# Patient Record
Sex: Male | Born: 1960 | Race: White | Hispanic: No | Marital: Single | Smoking: Current every day smoker
Health system: Southern US, Community
[De-identification: ages and names within clinical notes are randomized; demographics above are authoritative.]

## PROBLEM LIST (undated history)

## (undated) DIAGNOSIS — I513 Intracardiac thrombosis, not elsewhere classified: Secondary | ICD-10-CM

## (undated) DIAGNOSIS — E7800 Pure hypercholesterolemia, unspecified: Secondary | ICD-10-CM

## (undated) DIAGNOSIS — IMO0002 Reserved for concepts with insufficient information to code with codable children: Secondary | ICD-10-CM

## (undated) DIAGNOSIS — I2510 Atherosclerotic heart disease of native coronary artery without angina pectoris: Secondary | ICD-10-CM

## (undated) DIAGNOSIS — I255 Ischemic cardiomyopathy: Secondary | ICD-10-CM

## (undated) DIAGNOSIS — I219 Acute myocardial infarction, unspecified: Secondary | ICD-10-CM

## (undated) DIAGNOSIS — Z720 Tobacco use: Secondary | ICD-10-CM

## (undated) DIAGNOSIS — I5021 Acute systolic (congestive) heart failure: Secondary | ICD-10-CM

## (undated) DIAGNOSIS — E785 Hyperlipidemia, unspecified: Secondary | ICD-10-CM

## (undated) DIAGNOSIS — I5022 Chronic systolic (congestive) heart failure: Secondary | ICD-10-CM

## (undated) DIAGNOSIS — I10 Essential (primary) hypertension: Secondary | ICD-10-CM

## (undated) DIAGNOSIS — R0600 Dyspnea, unspecified: Secondary | ICD-10-CM

## (undated) DIAGNOSIS — E1165 Type 2 diabetes mellitus with hyperglycemia: Secondary | ICD-10-CM

## (undated) HISTORY — PX: NO PAST SURGERIES: SHX2092

## (undated) HISTORY — DX: Acute systolic (congestive) heart failure: I50.21

## (undated) HISTORY — DX: Pure hypercholesterolemia, unspecified: E78.00

## (undated) HISTORY — DX: Acute myocardial infarction, unspecified: I21.9

---

## 2019-08-08 ENCOUNTER — Encounter (HOSPITAL_COMMUNITY)
Admission: EM | Disposition: A | Discharge status: Home / Self Care | Payer: Self-pay | Source: Home / Self Care | Attending: Cardiology

## 2019-08-08 ENCOUNTER — Inpatient Hospital Stay (HOSPITAL_COMMUNITY)
Admission: EM | Admit: 2019-08-08 | Discharge: 2019-08-14 | DRG: 280 | Disposition: A | Discharge status: Home / Self Care | Payer: Self-pay | Attending: Cardiology | Admitting: Cardiology

## 2019-08-08 ENCOUNTER — Encounter (HOSPITAL_COMMUNITY): Payer: Self-pay | Admitting: Cardiology

## 2019-08-08 ENCOUNTER — Emergency Department (HOSPITAL_COMMUNITY): Payer: Self-pay

## 2019-08-08 DIAGNOSIS — IMO0002 Reserved for concepts with insufficient information to code with codable children: Secondary | ICD-10-CM

## 2019-08-08 DIAGNOSIS — I255 Ischemic cardiomyopathy: Secondary | ICD-10-CM | POA: Diagnosis present

## 2019-08-08 DIAGNOSIS — Z7982 Long term (current) use of aspirin: Secondary | ICD-10-CM

## 2019-08-08 DIAGNOSIS — I25119 Atherosclerotic heart disease of native coronary artery with unspecified angina pectoris: Secondary | ICD-10-CM | POA: Diagnosis present

## 2019-08-08 DIAGNOSIS — I214 Non-ST elevation (NSTEMI) myocardial infarction: Secondary | ICD-10-CM

## 2019-08-08 DIAGNOSIS — Z720 Tobacco use: Secondary | ICD-10-CM

## 2019-08-08 DIAGNOSIS — I5043 Acute on chronic combined systolic (congestive) and diastolic (congestive) heart failure: Secondary | ICD-10-CM | POA: Diagnosis present

## 2019-08-08 DIAGNOSIS — I2584 Coronary atherosclerosis due to calcified coronary lesion: Secondary | ICD-10-CM | POA: Diagnosis present

## 2019-08-08 DIAGNOSIS — I5020 Unspecified systolic (congestive) heart failure: Secondary | ICD-10-CM

## 2019-08-08 DIAGNOSIS — I2510 Atherosclerotic heart disease of native coronary artery without angina pectoris: Secondary | ICD-10-CM

## 2019-08-08 DIAGNOSIS — I472 Ventricular tachycardia: Secondary | ICD-10-CM | POA: Diagnosis present

## 2019-08-08 DIAGNOSIS — R52 Pain, unspecified: Secondary | ICD-10-CM

## 2019-08-08 DIAGNOSIS — R0602 Shortness of breath: Secondary | ICD-10-CM

## 2019-08-08 DIAGNOSIS — E785 Hyperlipidemia, unspecified: Secondary | ICD-10-CM | POA: Diagnosis present

## 2019-08-08 DIAGNOSIS — I5021 Acute systolic (congestive) heart failure: Secondary | ICD-10-CM | POA: Insufficient documentation

## 2019-08-08 DIAGNOSIS — I10 Essential (primary) hypertension: Secondary | ICD-10-CM

## 2019-08-08 DIAGNOSIS — E1165 Type 2 diabetes mellitus with hyperglycemia: Secondary | ICD-10-CM | POA: Diagnosis present

## 2019-08-08 DIAGNOSIS — I513 Intracardiac thrombosis, not elsewhere classified: Secondary | ICD-10-CM | POA: Diagnosis present

## 2019-08-08 DIAGNOSIS — R42 Dizziness and giddiness: Secondary | ICD-10-CM | POA: Diagnosis not present

## 2019-08-08 DIAGNOSIS — Z6841 Body Mass Index (BMI) 40.0 and over, adult: Secondary | ICD-10-CM

## 2019-08-08 DIAGNOSIS — Z20828 Contact with and (suspected) exposure to other viral communicable diseases: Secondary | ICD-10-CM | POA: Diagnosis present

## 2019-08-08 HISTORY — DX: Chronic systolic (congestive) heart failure: I50.22

## 2019-08-08 HISTORY — DX: Intracardiac thrombosis, not elsewhere classified: I51.3

## 2019-08-08 HISTORY — DX: Essential (primary) hypertension: I10

## 2019-08-08 HISTORY — DX: Dyspnea, unspecified: R06.00

## 2019-08-08 HISTORY — DX: Reserved for concepts with insufficient information to code with codable children: IMO0002

## 2019-08-08 HISTORY — DX: Hyperlipidemia, unspecified: E78.5

## 2019-08-08 HISTORY — DX: Tobacco use: Z72.0

## 2019-08-08 HISTORY — DX: Morbid (severe) obesity due to excess calories: E66.01

## 2019-08-08 HISTORY — DX: Ischemic cardiomyopathy: I25.5

## 2019-08-08 HISTORY — PX: LEFT HEART CATH AND CORONARY ANGIOGRAPHY: CATH118249

## 2019-08-08 HISTORY — DX: Atherosclerotic heart disease of native coronary artery without angina pectoris: I25.10

## 2019-08-08 HISTORY — DX: Type 2 diabetes mellitus with hyperglycemia: E11.65

## 2019-08-08 HISTORY — DX: Non-ST elevation (NSTEMI) myocardial infarction: I21.4

## 2019-08-08 LAB — HEMOGLOBIN A1C
Hgb A1c MFr Bld: 10.2 % — ABNORMAL HIGH (ref 4.8–5.6)
Mean Plasma Glucose: 246.04 mg/dL

## 2019-08-08 LAB — APTT: aPTT: 26 seconds (ref 24–36)

## 2019-08-08 LAB — CBC
HCT: 47.9 % (ref 39.0–52.0)
Hemoglobin: 15.9 g/dL (ref 13.0–17.0)
MCH: 29.6 pg (ref 26.0–34.0)
MCHC: 33.2 g/dL (ref 30.0–36.0)
MCV: 89 fL (ref 80.0–100.0)
Platelets: 194 10*3/uL (ref 150–400)
RBC: 5.38 MIL/uL (ref 4.22–5.81)
RDW: 12.9 % (ref 11.5–15.5)
WBC: 11.3 10*3/uL — ABNORMAL HIGH (ref 4.0–10.5)
nRBC: 0 % (ref 0.0–0.2)

## 2019-08-08 LAB — GLUCOSE, CAPILLARY: Glucose-Capillary: 433 mg/dL — ABNORMAL HIGH (ref 70–99)

## 2019-08-08 LAB — LIPID PANEL
Cholesterol: 269 mg/dL — ABNORMAL HIGH (ref 0–200)
HDL: 41 mg/dL (ref >40)
LDL Cholesterol: 210 mg/dL — ABNORMAL HIGH (ref 0–99)
Total CHOL/HDL Ratio: 6.6 RATIO
Triglycerides: 89 mg/dL (ref <150)
VLDL: 18 mg/dL (ref 0–40)

## 2019-08-08 LAB — BASIC METABOLIC PANEL
Anion gap: 12 (ref 5–15)
BUN: 15 mg/dL (ref 6–20)
CO2: 20 mmol/L — ABNORMAL LOW (ref 22–32)
Calcium: 8.9 mg/dL (ref 8.9–10.3)
Chloride: 103 mmol/L (ref 98–111)
Creatinine, Ser: 1.26 mg/dL — ABNORMAL HIGH (ref 0.61–1.24)
GFR calc Af Amer: >60 mL/min (ref >60)
GFR calc non Af Amer: >60 mL/min (ref >60)
Glucose, Bld: 337 mg/dL — ABNORMAL HIGH (ref 70–99)
Potassium: 4.9 mmol/L (ref 3.5–5.1)
Sodium: 135 mmol/L (ref 135–145)

## 2019-08-08 LAB — BRAIN NATRIURETIC PEPTIDE
B Natriuretic Peptide: 469.2 pg/mL — ABNORMAL HIGH (ref 0.0–100.0)
B Natriuretic Peptide: 395.3 pg/mL — ABNORMAL HIGH (ref 0.0–100.0)

## 2019-08-08 LAB — TROPONIN I (HIGH SENSITIVITY)
Troponin I (High Sensitivity): 1047 ng/L (ref <18)
Troponin I (High Sensitivity): 3525 ng/L (ref <18)
Troponin I (High Sensitivity): 4988 ng/L (ref <18)
Troponin I (High Sensitivity): 5226 ng/L (ref <18)

## 2019-08-08 LAB — MAGNESIUM: Magnesium: 2 mg/dL (ref 1.7–2.4)

## 2019-08-08 LAB — SARS CORONAVIRUS 2 (TAT 6-24 HRS): SARS Coronavirus 2: NEGATIVE

## 2019-08-08 LAB — HIV ANTIBODY (ROUTINE TESTING W REFLEX): HIV Screen 4th Generation wRfx: NONREACTIVE

## 2019-08-08 LAB — POC SARS CORONAVIRUS 2 AG -  ED: SARS Coronavirus 2 Ag: NEGATIVE

## 2019-08-08 LAB — TSH: TSH: 2.4 u[IU]/mL (ref 0.350–4.500)

## 2019-08-08 LAB — PROTIME-INR
INR: 1.1 (ref 0.8–1.2)
Prothrombin Time: 13.6 seconds (ref 11.4–15.2)

## 2019-08-08 SURGERY — LEFT HEART CATH AND CORONARY ANGIOGRAPHY
Anesthesia: LOCAL

## 2019-08-08 MED ORDER — SODIUM CHLORIDE 0.9 % WEIGHT BASED INFUSION
3.0000 mL/kg/h | INTRAVENOUS | Status: AC
  Administered 2019-08-08: 3 mL/kg/h via INTRAVENOUS

## 2019-08-08 MED ORDER — ACETAMINOPHEN 325 MG PO TABS: 650.0000 mg | ORAL_TABLET | ORAL | Status: DC | PRN

## 2019-08-08 MED ORDER — NITROGLYCERIN IN D5W 200-5 MCG/ML-% IV SOLN
INTRAVENOUS | Status: AC | PRN
  Administered 2019-08-08: 20 ug/min via INTRAVENOUS

## 2019-08-08 MED ORDER — SODIUM CHLORIDE 0.9% FLUSH
3.0000 mL | Freq: Two times a day (BID) | INTRAVENOUS | Status: DC
  Administered 2019-08-08 – 2019-08-14 (×10): 3 mL via INTRAVENOUS

## 2019-08-08 MED ORDER — SODIUM CHLORIDE 0.9% FLUSH
3.0000 mL | Freq: Once | INTRAVENOUS | Status: AC
  Administered 2019-08-08: 3 mL via INTRAVENOUS

## 2019-08-08 MED ORDER — HEPARIN (PORCINE) IN NACL 1000-0.9 UT/500ML-% IV SOLN
INTRAVENOUS | Status: DC | PRN
  Administered 2019-08-08 (×2): 500 mL

## 2019-08-08 MED ORDER — ONDANSETRON HCL 4 MG/2ML IJ SOLN: 4.0000 mg | Freq: Four times a day (QID) | INTRAMUSCULAR | Status: DC | PRN

## 2019-08-08 MED ORDER — ANGIOPLASTY BOOK
Freq: Once | Status: AC
  Administered 2019-08-08: 10:00:00
  Filled 2019-08-08: qty 1

## 2019-08-08 MED ORDER — LIDOCAINE HCL (PF) 1 % IJ SOLN
INTRAMUSCULAR | Status: AC
  Filled 2019-08-08: qty 30

## 2019-08-08 MED ORDER — FENTANYL CITRATE (PF) 100 MCG/2ML IJ SOLN
INTRAMUSCULAR | Status: AC
  Filled 2019-08-08: qty 2

## 2019-08-08 MED ORDER — FENTANYL CITRATE (PF) 100 MCG/2ML IJ SOLN
INTRAMUSCULAR | Status: DC | PRN
  Administered 2019-08-08: 25 ug via INTRAVENOUS

## 2019-08-08 MED ORDER — SODIUM CHLORIDE 0.9% FLUSH: 3.0000 mL | Freq: Two times a day (BID) | INTRAVENOUS | Status: DC

## 2019-08-08 MED ORDER — ASPIRIN 81 MG PO CHEW
324.0000 mg | CHEWABLE_TABLET | Freq: Once | ORAL | Status: AC
  Administered 2019-08-08: 324 mg via ORAL
  Filled 2019-08-08: qty 4

## 2019-08-08 MED ORDER — ASPIRIN EC 81 MG PO TBEC
81.0000 mg | DELAYED_RELEASE_TABLET | Freq: Every day | ORAL | Status: DC
  Administered 2019-08-08 – 2019-08-14 (×7): 81 mg via ORAL
  Filled 2019-08-08 (×7): qty 1

## 2019-08-08 MED ORDER — SODIUM CHLORIDE 0.9 % IV SOLN: INTRAVENOUS | Status: AC

## 2019-08-08 MED ORDER — FUROSEMIDE 10 MG/ML IJ SOLN
INTRAMUSCULAR | Status: AC
  Filled 2019-08-08: qty 4

## 2019-08-08 MED ORDER — ALBUTEROL SULFATE HFA 108 (90 BASE) MCG/ACT IN AERS
8.0000 | INHALATION_SPRAY | Freq: Once | RESPIRATORY_TRACT | Status: AC
  Administered 2019-08-08: 8 via RESPIRATORY_TRACT
  Filled 2019-08-08: qty 6.7

## 2019-08-08 MED ORDER — INSULIN ASPART 100 UNIT/ML ~~LOC~~ SOLN
3.0000 [IU] | Freq: Once | SUBCUTANEOUS | Status: AC
  Administered 2019-08-08: 3 [IU] via SUBCUTANEOUS

## 2019-08-08 MED ORDER — SODIUM CHLORIDE 0.9 % IV SOLN: 250.0000 mL | INTRAVENOUS | Status: DC | PRN

## 2019-08-08 MED ORDER — IOHEXOL 350 MG/ML SOLN
INTRAVENOUS | Status: DC | PRN
  Administered 2019-08-08: 120 mL

## 2019-08-08 MED ORDER — SODIUM CHLORIDE 0.9 % WEIGHT BASED INFUSION
1.0000 mL/kg/h | INTRAVENOUS | Status: DC
  Administered 2019-08-08: 1 mL/kg/h via INTRAVENOUS

## 2019-08-08 MED ORDER — METHYLPREDNISOLONE SODIUM SUCC 125 MG IJ SOLR
125.0000 mg | Freq: Once | INTRAMUSCULAR | Status: AC
  Administered 2019-08-08: 125 mg via INTRAVENOUS
  Filled 2019-08-08: qty 2

## 2019-08-08 MED ORDER — LABETALOL HCL 5 MG/ML IV SOLN: 10.0000 mg | INTRAVENOUS | Status: AC | PRN

## 2019-08-08 MED ORDER — VERAPAMIL HCL 2.5 MG/ML IV SOLN
INTRAVENOUS | Status: AC
  Filled 2019-08-08: qty 2

## 2019-08-08 MED ORDER — CARVEDILOL 3.125 MG PO TABS: 3.1250 mg | ORAL_TABLET | Freq: Two times a day (BID) | ORAL | Status: DC

## 2019-08-08 MED ORDER — MIDAZOLAM HCL 2 MG/2ML IJ SOLN
INTRAMUSCULAR | Status: DC | PRN
  Administered 2019-08-08: 1 mg via INTRAVENOUS

## 2019-08-08 MED ORDER — NITROGLYCERIN IN D5W 200-5 MCG/ML-% IV SOLN
0.0000 ug/min | INTRAVENOUS | Status: DC
  Administered 2019-08-08 – 2019-08-10 (×2): 20 ug/min via INTRAVENOUS
  Filled 2019-08-08: qty 250

## 2019-08-08 MED ORDER — NITROGLYCERIN 0.4 MG SL SUBL: 0.4000 mg | SUBLINGUAL_TABLET | SUBLINGUAL | Status: DC | PRN

## 2019-08-08 MED ORDER — INSULIN ASPART 100 UNIT/ML ~~LOC~~ SOLN
0.0000 [IU] | Freq: Three times a day (TID) | SUBCUTANEOUS | Status: DC
  Administered 2019-08-09: 5 [IU] via SUBCUTANEOUS
  Administered 2019-08-09: 11 [IU] via SUBCUTANEOUS
  Administered 2019-08-09: 5 [IU] via SUBCUTANEOUS
  Administered 2019-08-10 – 2019-08-11 (×6): 3 [IU] via SUBCUTANEOUS
  Administered 2019-08-12 (×2): 2 [IU] via SUBCUTANEOUS
  Administered 2019-08-12: 5 [IU] via SUBCUTANEOUS
  Administered 2019-08-13: 18:00:00 3 [IU] via SUBCUTANEOUS
  Administered 2019-08-13: 10:00:00 5 [IU] via SUBCUTANEOUS
  Administered 2019-08-13: 13:00:00 2 [IU] via SUBCUTANEOUS
  Administered 2019-08-14: 08:00:00 3 [IU] via SUBCUTANEOUS

## 2019-08-08 MED ORDER — ATORVASTATIN CALCIUM 40 MG PO TABS
40.0000 mg | ORAL_TABLET | Freq: Every day | ORAL | Status: DC
  Administered 2019-08-08: 40 mg via ORAL
  Filled 2019-08-08: qty 1

## 2019-08-08 MED ORDER — HYDRALAZINE HCL 20 MG/ML IJ SOLN: 10.0000 mg | INTRAMUSCULAR | Status: AC | PRN

## 2019-08-08 MED ORDER — FUROSEMIDE 10 MG/ML IJ SOLN
INTRAMUSCULAR | Status: DC | PRN
  Administered 2019-08-08: 40 mg via INTRAVENOUS

## 2019-08-08 MED ORDER — HEPARIN (PORCINE) 25000 UT/250ML-% IV SOLN
1700.0000 [IU]/h | INTRAVENOUS | Status: DC
  Administered 2019-08-08: 1400 [IU]/h via INTRAVENOUS
  Filled 2019-08-08: qty 250

## 2019-08-08 MED ORDER — HEPARIN SODIUM (PORCINE) 1000 UNIT/ML IJ SOLN
INTRAMUSCULAR | Status: AC
  Filled 2019-08-08: qty 1

## 2019-08-08 MED ORDER — HEPARIN BOLUS VIA INFUSION
4000.0000 [IU] | Freq: Once | INTRAVENOUS | Status: AC
  Administered 2019-08-08: 4000 [IU] via INTRAVENOUS
  Filled 2019-08-08: qty 4000

## 2019-08-08 MED ORDER — HEPARIN SODIUM (PORCINE) 1000 UNIT/ML IJ SOLN
INTRAMUSCULAR | Status: DC | PRN
  Administered 2019-08-08: 6000 [IU] via INTRAVENOUS

## 2019-08-08 MED ORDER — INSULIN GLARGINE 100 UNIT/ML ~~LOC~~ SOLN
8.0000 [IU] | Freq: Every day | SUBCUTANEOUS | Status: DC
  Administered 2019-08-08: 8 [IU] via SUBCUTANEOUS
  Filled 2019-08-08 (×2): qty 0.08

## 2019-08-08 MED ORDER — VERAPAMIL HCL 2.5 MG/ML IV SOLN
INTRAVENOUS | Status: DC | PRN
  Administered 2019-08-08: 10 mL via INTRA_ARTERIAL

## 2019-08-08 MED ORDER — MIDAZOLAM HCL 2 MG/2ML IJ SOLN
INTRAMUSCULAR | Status: AC
  Filled 2019-08-08: qty 2

## 2019-08-08 MED ORDER — ASPIRIN 81 MG PO CHEW: 81.0000 mg | CHEWABLE_TABLET | Freq: Every day | ORAL | Status: DC

## 2019-08-08 MED ORDER — INSULIN ASPART 100 UNIT/ML ~~LOC~~ SOLN
0.0000 [IU] | Freq: Every day | SUBCUTANEOUS | Status: DC
  Administered 2019-08-08: 5 [IU] via SUBCUTANEOUS
  Administered 2019-08-09: 2 [IU] via SUBCUTANEOUS

## 2019-08-08 MED ORDER — SODIUM CHLORIDE 0.9% FLUSH: 3.0000 mL | INTRAVENOUS | Status: DC | PRN

## 2019-08-08 MED ORDER — LIDOCAINE HCL (PF) 1 % IJ SOLN
INTRAMUSCULAR | Status: DC | PRN
  Administered 2019-08-08: 5 mL

## 2019-08-08 MED ORDER — OXYCODONE HCL 5 MG PO TABS: 5.0000 mg | ORAL_TABLET | ORAL | Status: DC | PRN

## 2019-08-08 MED ORDER — HEPARIN (PORCINE) IN NACL 1000-0.9 UT/500ML-% IV SOLN
INTRAVENOUS | Status: AC
  Filled 2019-08-08: qty 1000

## 2019-08-08 MED ORDER — HEPARIN (PORCINE) 25000 UT/250ML-% IV SOLN
1400.0000 [IU]/h | INTRAVENOUS | Status: DC
  Administered 2019-08-08: 1400 [IU]/h via INTRAVENOUS
  Filled 2019-08-08: qty 500

## 2019-08-08 MED ORDER — ASPIRIN EC 325 MG PO TBEC: 325.0000 mg | DELAYED_RELEASE_TABLET | Freq: Every day | ORAL | Status: DC

## 2019-08-08 SURGICAL SUPPLY — 11 items
CATH 5FR JL3.5 JR4 ANG PIG MP (CATHETERS) ×2 IMPLANT
DEVICE RAD COMP TR BAND LRG (VASCULAR PRODUCTS) ×2 IMPLANT
GLIDESHEATH SLEND A-KIT 6F 22G (SHEATH) ×2 IMPLANT
GLIDESHEATH SLEND SS 6F .021 (SHEATH) IMPLANT
GUIDEWIRE INQWIRE 1.5J.035X260 (WIRE) ×1 IMPLANT
INQWIRE 1.5J .035X260CM (WIRE) ×2
KIT HEART LEFT (KITS) ×2 IMPLANT
PACK CARDIAC CATHETERIZATION (CUSTOM PROCEDURE TRAY) ×2 IMPLANT
SHEATH PROBE COVER 6X72 (BAG) ×2 IMPLANT
TRANSDUCER W/STOPCOCK (MISCELLANEOUS) ×2 IMPLANT
TUBING CIL FLEX 10 FLL-RA (TUBING) ×2 IMPLANT

## 2019-08-08 NOTE — Progress Notes (Signed)
Telemetry notified RN that pt had 19 run of v-tach, assessed and pt was asymptomatic, will continue to monitor. HR 118 and BP 122/90 @ 2220.   Elaina Hoops, RN

## 2019-08-08 NOTE — ED Provider Notes (Addendum)
[facility] EMERGENCY DEPARTMENT Provider Note   CSN: [id number] Arrival date & time: 08/08/19  0130     History   Chief Complaint Chief Complaint  Patient presents with  . Chest Pain  . Shortness of Breath    HPI Michael [name] is a 58 y.o. male.     The history is provided by the patient and medical records.  Chest Pain Associated symptoms: shortness of breath   Shortness of Breath Associated symptoms: chest pain      58 y.o. 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  States this has been an intermittent issues yesterday, worst last evening.   States he had Thanksgiving dinner with his family at nephews house and afterwards starting having SOB and chest discomfort.  Chest discomfort is substernal in nature, described as a mild pressure but not quite a heaviness.  He states SOB has continued to be intermittent, lasting a few minutes at a time.  SOB and pain worse with lying flat, better sitting upright.  He denies known cardiac history.  No known family cardiac history.  He is not a smoker.  He does report a recent cough, white phlegm produced and has felt some wheezing.  He denies any hemoptysis.   No pain with breathing.  States he does get some burning pain in his chest with breathing at times.  No meds PTA.  No past medical history on file.  There are no active problems to display for this patient.      Home Medications    Prior to Admission medications   Medication Sig Start Date End Date Taking? Authorizing Provider  aspirin 325 MG tablet Take 325 mg by mouth once.   Yes [provider]    Family History No family history on file.  Social History Social History   Tobacco Use  . Smoking status: Not on file  Substance Use Topics  . Alcohol use: Not on file  . Drug use: Not on file     Allergies   Patient has no known allergies.   Review of  Systems Review of Systems  Respiratory: Positive for shortness of breath.   Cardiovascular: Positive for chest pain.  All other systems reviewed and are negative.    Physical Exam Updated Vital Signs BP 126/85   Pulse (!) 108   Temp 98.6 F (37 C) (Oral)   Resp 20   SpO2 97%   Physical Exam Vitals signs and nursing note reviewed.  Constitutional:      Appearance: He is well-developed.     Comments: obese  HENT:     Head: Normocephalic and atraumatic.  Eyes:     Conjunctiva/sclera: Conjunctivae normal.     Pupils: Pupils are equal, round, and reactive to light.  Neck:     Musculoskeletal: Normal range of motion.  Cardiovascular:     Rate and Rhythm: Regular rhythm. Tachycardia present.     Heart sounds: Normal heart sounds.     Comments: Tachycardia around 105 during exam, sinus Pulmonary:     Effort: Pulmonary effort is normal.     Breath sounds: Wheezing present. No decreased breath sounds or rhonchi.     Comments: Expiratory wheezes noted, worse in the upper lobes, able to speak in sentences Abdominal:     General: Bowel sounds are normal.     Palpations: Abdomen is soft.  Musculoskeletal: Normal  range of motion.     Comments: Trace edema at the ankles  Skin:    General: Skin is warm and dry.  Neurological:     Mental Status: He is alert and oriented to person, place, and time.      ED Treatments / Results  Labs (all labs ordered are listed, but only abnormal results are displayed) Labs Reviewed  BASIC METABOLIC PANEL - Abnormal; Notable for the following components:      Result Value   CO2 20 (*)    Glucose, Bld 337 (*)    Creatinine, Ser 1.26 (*)    All other components within normal limits  CBC - Abnormal; Notable for the following components:   WBC 11.3 (*)    All other components within normal limits  BRAIN NATRIURETIC PEPTIDE - Abnormal; Notable for the following components:   B Natriuretic Peptide 469.2 (*)    All other components within  normal limits  TROPONIN I (HIGH SENSITIVITY) - Abnormal; Notable for the following components:   Troponin I (High Sensitivity) 1,047 (*)    All other components within normal limits  TROPONIN I (HIGH SENSITIVITY) - Abnormal; Notable for the following components:   Troponin I (High Sensitivity) 3,525 (*)    All other components within normal limits  SARS CORONAVIRUS 2 (TAT 6-24 HRS)  POC SARS CORONAVIRUS 2 AG -  ED    EKG EKG Interpretation  Date/Time:  Friday August 08 2019 01:41:08 EST Ventricular Rate:  112 PR Interval:    QRS Duration: 106 QT Interval:  346 QTC Calculation: 473 R Axis:   56 Text Interpretation: Sinus tachycardia Anterior infarct, old No previous ECGs available Confirmed by Zadie RhineWickline, Donald ([id number]) on 08/08/2019 1:48:53 AM   Radiology Dg Chest Port 1 View  Result Date: 08/08/2019 CLINICAL DATA:  Chest pain and shortness of breath EXAM: PORTABLE CHEST 1 VIEW COMPARISON:  None. FINDINGS: Cardiac shadow is mildly enlarged but accentuated by the portable technique. Vascular congestion is noted centrally with mild interstitial edema consistent with congestive failure. Small right pleural effusion is noted. Bibasilar atelectatic changes are noted. IMPRESSION: Changes consistent with mild CHF with bibasilar atelectatic changes. Electronically Signed   By: Alcide CleverMark  Lukens M.D.   On: 08/08/2019 03:27    Procedures Procedures (including critical care time)  CRITICAL CARE Performed by: Garlon HatchetLisa M Sanders   Total critical care time: 45 minutes  Critical care time was exclusive of separately billable procedures and treating other patients.  Critical care was necessary to treat or prevent imminent or life-threatening deterioration.  Critical care was time spent personally by me on the following activities: development of treatment plan with patient and/or surrogate as well as nursing, discussions with consultants, evaluation of patient's response to treatment, examination  of patient, obtaining history from patient or surrogate, ordering and performing treatments and interventions, ordering and review of laboratory studies, ordering and review of radiographic studies, pulse oximetry and re-evaluation of patient's condition.   Medications Ordered in ED Medications  sodium chloride flush (NS) 0.9 % injection 3 mL (3 mLs Intravenous Given 08/08/19 0437)  albuterol (VENTOLIN HFA) 108 (90 Base) MCG/ACT inhaler 8 puff (8 puffs Inhalation Given 08/08/19 0321)  methylPREDNISolone sodium succinate (SOLU-MEDROL) 125 mg/2 mL injection 125 mg (125 mg Intravenous Given 08/08/19 0322)  aspirin chewable tablet 324 mg (324 mg Oral Given 08/08/19 0456)     Initial Impression / Assessment and Plan / ED Course  I have reviewed the triage vital signs and the nursing notes.  Pertinent labs & imaging results that were available during my care of the patient were reviewed by me and considered in my medical decision making (see chart for details).  58-year-ol male here with substernal chest discomfort and intermittent shortness of breath since yesterday.  He has been having cough with white phlegm but no hemoptysis.  No fever or chills.  Brother has also been sick with URI symptoms, unknown if related to Covid as he has not had formal testing.  Symptoms worsened after eating dinner with family last evening.  Patient with a low-grade tachycardia around 105 during exam.  He does have some expiratory wheezes, worse in the upper lobes.  He does not appear significantly fluid overloaded, trace edema at the ankles.  EKG here sinus tachycardia without noted ischemic changes.  Labs as above--hyperglycemia without findings of DKA.  Troponin is 1047.  No known cardiac history, however has not had a physical exam or seen a doctor in several years.  CXR with some mild CHF and bibasilar atelectasis.  BNP added on.  Given some albuterol as well as solu-medrol for wheezing. Patient given aspirin.  Will  discuss with cardiology.  Discussed with cardiology Fellow, Dr. Ladean Raya-- will admit for ongoing care.  Will start Heparin drip.  Plan for cardiac cath later today.  Final Clinical Impressions(s) / ED Diagnoses   Final diagnoses:  NSTEMI (non-ST elevated myocardial infarction) [facility])    ED Discharge Orders    None       Garlon Hatchet, PA-C 08/08/19 0531    Garlon Hatchet, PA-C 08/08/19 7416    Shon Baton, MD 08/08/19 [phone]

## 2019-08-08 NOTE — Progress Notes (Signed)
ANTICOAGULATION CONSULT NOTE - Initial Consult  Pharmacy Consult for heparin Indication: chest pain/ACS  No Known Allergies  Patient Measurements: Height: 6' (182.9 cm) Weight: 300 lb (136.1 kg) IBW/kg (Calculated) : 77.6 Heparin Dosing Weight: 108.7 kg   Vital Signs: Temp: 98.6 F (37 C) (11/27 0140) Temp Source: Oral (11/27 0140) BP: 130/92 (11/27 0439) Pulse Rate: 112 (11/27 0439)  Labs: Recent Labs    08/08/19 0147 08/08/19 0347  HGB 15.9  --   HCT 47.9  --   PLT 194  --   CREATININE 1.26*  --   TROPONINIHS 1,047* 3,525*    Estimated Creatinine Clearance: 93.5 mL/min (A) (by C-G formula based on SCr of 1.26 mg/dL (H)).   Medical History: No past medical history on file.  Medications:  See medication history  Assessment: 58 yo man to start heparin for CP.  He was not on anticoagulation PTA.  Hg 15.9, PTLC 194 Goal of Therapy:  Heparin level 0.3-0.7 units/ml Monitor platelets by anticoagulation protocol: Yes   Plan:  Heparin 4000 unit bolus and drip at 1400 units/hr Check heparin level ~6 hours after start Daily HL and CBC while on heparin Monitor for bleeding complications  Makhayla Mcmurry Poteet 08/08/2019,6:16 AM

## 2019-08-08 NOTE — H&P (Signed)
Cardiology History & Physical    Patient ID: Michael [name] MRN: [id number], DOB/AGE: 56-Mar-1962   Admit date: 08/08/2019  Primary Physician: Patient, No Pcp Per Primary Cardiologist: No primary care provider on file.  Patient Profile    58 year old man with no known medical issues, has not seen a physician in > 10-years. He presented with intermittent chest pain and dyspnea, found to have troponin elevation prompting admission to cardiology.   History of Present Illness    Michael [name] reports no prior cardiac history, denies chronic medical diagnosis or medications, but says he thinks he may have diabetes. 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 He says over the past few years he has intermittent chest pain, he had one particularly bad episode 3-years ago after his mother died, but episodes always resolve on their own so he never sought medical attention.  Yesterday evening with family around Thanksgiving, he started having recurrent substernal chest pain associated with dyspnea, did not relent so he presented to our ED. CP resolved shortly afterwards and he is currently chest pain free. He does report current orthopnea, which is chronic. He also notes chronic exertional dyspnea with just walking up even a small hill. He says this has been slowly worsening over the past few years, but particularly in the past few months. He also notes episodes of PND and says his legs have been swollen.    Past Medical History   Past Medical History:  Diagnosis Date  . Dyspnea   . Tobacco use      Allergies No Known Allergies  Home Medications    Prior to Admission medications   Medication Sig Start Date End Date Taking? Authorizing Provider  aspirin 325 MG tablet Take 325 mg by mouth once.   Yes [provider]    Family History    No family history on file. He indicated that his mother  is deceased. He indicated that his father is deceased. + cardiac dz in family   Social History    Social History   Socioeconomic History  . Marital status: Single    Spouse name: Not on file  . Number of children: Not on file  . Years of education: Not on file  . Highest education level: Not on file  Occupational History  . Not on file  Social Needs  . Financial resource strain: Not on file  . Food insecurity    Worry: Not on file    Inability: Not on file  . Transportation needs    Medical: Not on file    Non-medical: Not on file  Tobacco Use  . Smoking status: Not on file  . Smokeless tobacco: Current User    Types: Chew  Substance and Sexual Activity  . Alcohol use: Not on file  . Drug use: Not on file  . Sexual activity: Not on file  Lifestyle  . Physical activity    Days per week: Not on file    Minutes per session: Not on file  . Stress: Not on file  Relationships  . Social Musicianconnections    Talks on phone: Not on file    Gets together: Not on file    Attends religious service: Not on file    Active member of club or organization: Not on file    Attends meetings of clubs or organizations: Not on file    Relationship  status: Not on file  . Intimate partner violence    Fear of current or ex partner: Not on file    Emotionally abused: Not on file    Physically abused: Not on file    Forced sexual activity: Not on file  Other Topics Concern  . Not on file  Social History Narrative  . Not on file     Review of Systems    General:  No chills, fever, night sweats or weight changes.  Cardiovascular:  See HPI, no palpitations Dermatological: No rash, lesions/masses Respiratory: No cough, dyspnea Urologic: No hematuria, dysuria Abdominal:   No nausea, vomiting, diarrhea, bright red blood per rectum, melena, or hematemesis Neurologic:  No visual changes, wkns, changes in mental status. All other systems reviewed and are otherwise negative except as noted above.   Physical Exam    BP 123/87   Pulse (!) 103   Temp 98.6 F (37 C) (Oral)   Resp 17   Ht 6' (1.829 m)   Wt 136.1 kg Comment: pt stated weight  SpO2 96%   BMI 40.69 kg/m  General: pleasant, obese, no distress sitting upright HEENT: Normal  Neck: JVP appears mild/mod elevated Lungs:  Resp regular and unlabored on 2L oxygen, diminished breath sounds in lung bases but no wheezing Heart: distant heart sounds, Regular rhythm, no S3, S4, or murmurs. Abdomen: Soft, non-tender, non-distended, BS +.  Extremities: 1+ bilateral LE edema, DP/PT/Radials 2+ and equal bilaterally. Psych: Normal affect. Neuro: Alert and oriented. No gross focal deficits. No abnormal movements.  Labs    Troponin (Point of Care Test) No results for input(s): TROPIPOC in the last 72 hours. No results for input(s): CKTOTAL, CKMB, TROPONINI in the last 72 hours. Lab Results  Component Value Date   WBC 11.3 (H) 08/08/2019   HGB 15.9 08/08/2019   HCT 47.9 08/08/2019   MCV 89.0 08/08/2019   PLT 194 08/08/2019    Recent Labs  Lab 08/08/19 0147  NA 135  K 4.9  CL 103  CO2 20*  BUN 15  CREATININE 1.26*  CALCIUM 8.9  GLUCOSE 337*   No results found for: CHOL, HDL, LDLCALC, TRIG No results found for: [facility]   Radiology Studies    Dg Chest Port 1 View  Result Date: 08/08/2019 CLINICAL DATA:  Chest pain and shortness of breath EXAM: PORTABLE CHEST 1 VIEW COMPARISON:  None. FINDINGS: Cardiac shadow is mildly enlarged but accentuated by the portable technique. Vascular congestion is noted centrally with mild interstitial edema consistent with congestive failure. Small right pleural effusion is noted. Bibasilar atelectatic changes are noted. IMPRESSION: Changes consistent with mild CHF with bibasilar atelectatic changes. Electronically Signed   By: Inez Catalina M.D.   On: 08/08/2019 03:27    ECG & Cardiac Imaging    - sinus tachycardia with anterior Q-waves V1-V4, no acute ischemic changes seen   Assessment & Plan    41M w/ no known cardiac history who presents with chest pain and troponin elevation, overall concerning for type 1 NSTEMI. Patient without medical care in > 10 years. Based on history I am concerned he's had prior undiagnosed MI, supporting this is marked anterior Q-waves on his ECG. Will start heparin infusion and keep NPO, plan for likely LHC later today. Additional will send BNP and obtain TTE, expect he will have significant LV dysfunction on echocardiogram. Pending return of above workup and labs, would also plan for diuresis later this AM.    NSTEMI, suspect type 1  -  troponin 1,047 -> 3,525, continue to trend to peak - serial ECG's, monitor closely on telemetry - aspirin loaded in ED, start heparin infusion - keep NPO, plan for likely LHC later today - f/u lipid panel, A1C  Possible new CHF diagnosis - strict I/O's, daily weights - keep K > 4.0 and Mg > 2.0 - f/u BNP, electrolytes, TTE ordered - would plan to diuresis later this AM pending above workup   Nutrition: DVT ppx: on heparin infusion GI ppx: not indicated Advanced Care Planning:   Signed, Sherryl Manges, MD 08/08/2019, 7:28 AM

## 2019-08-08 NOTE — ED Notes (Signed)
ED TO INPATIENT HANDOFF REPORT  ED Nurse Name and Phone #: [phone]  S Name/Age/Gender Michael [name] 58 y.o. male Room/Bed: 029C/029C  Code Status   Code Status: Full Code  Home/SNF/Other Home Patient oriented to: self, place, time and situation Is this baseline? Yes   Triage Complete: Triage complete  Chief Complaint CP/SOB  Triage Note Came in via EMS; c/o SOB and chest pain; stated on and off in nature but worst today.    Allergies No Known Allergies  Level of Care/Admitting Diagnosis ED Disposition    ED Disposition Condition Comment   Admit  Hospital Area: [facility] [[id number]]  Level of Care: Telemetry Cardiac [103]  Covid Evaluation: N/A  Diagnosis: NSTEMI (non-ST elevated myocardial infarction) [facility]) [[id number]]  Admitting Physician: Thomasena Edis [[id number]]  Attending Physician: Thomasena Edis [[id number]]  Estimated length of stay: 3 - 4 days  Certification:: I certify this patient will need inpatient services for at least 2 midnights  PT Class (Do Not Modify): Inpatient [101]  PT Acc Code (Do Not Modify): Private [1]       B Medical/Surgery History Past Medical History:  Diagnosis Date  . Dyspnea   . Tobacco use       A IV Location/Drains/Wounds Patient Lines/Drains/Airways Status   Active Line/Drains/Airways    Name:   Placement date:   Placement time:   Site:   Days:   Peripheral IV Right Antecubital   -    -    Antecubital             Intake/Output Last 24 hours No intake or output data in the 24 hours ending 08/08/19 0723  Labs/Imaging Results for orders placed or performed during the hospital encounter of 08/08/19 (from the past 48 hour(s))  Basic metabolic panel     Status: Abnormal   Collection Time: 08/08/19  1:47 AM  Result Value Ref Range   Sodium 135 135 - 145 mmol/L   Potassium 4.9 3.5 - 5.1 mmol/L   Chloride 103 98 - 111 mmol/L   CO2 20 (L) 22 - 32 mmol/L   Glucose, Bld 337 (H) 70 - 99 mg/dL   BUN 15  6 - 20 mg/dL   Creatinine, Ser 1.26 (H) 0.61 - 1.24 mg/dL   Calcium 8.9 8.9 - 10.3 mg/dL   GFR calc non Af Amer >60 >60 mL/min   GFR calc Af Amer >60 >60 mL/min   Anion gap 12 5 - 15    Comment: Performed at [facility], [place] [address] [place], [place] [address]  CBC     Status: Abnormal   Collection Time: 08/08/19  1:47 AM  Result Value Ref Range   WBC 11.3 (H) 4.0 - 10.5 K/uL   RBC 5.38 4.22 - 5.81 MIL/uL   Hemoglobin 15.9 13.0 - 17.0 g/dL   HCT 47.9 39.0 - 52.0 %   MCV 89.0 80.0 - 100.0 fL   MCH 29.6 26.0 - 34.0 pg   MCHC 33.2 30.0 - 36.0 g/dL   RDW 12.9 11.5 - 15.5 %   Platelets 194 150 - 400 K/uL   nRBC 0.0 0.0 - 0.2 %    Comment: Performed at [facility], [place] [address], [place], [place] [address]  Troponin I (High Sensitivity)     Status: Abnormal   Collection Time: 08/08/19  1:47 AM  Result Value Ref Range   Troponin I (High Sensitivity) 1,047 (HH) <18 ng/L    Comment:  CRITICAL RESULT CALLED TO, READ BACK BY AND VERIFIED WITH: RN L CHILTON @0255  08/08/19 BY S GEZAHEGN (NOTE) Elevated high sensitivity troponin I (hsTnI) values and significant  changes across serial measurements may suggest ACS but many other  chronic and acute conditions are known to elevate hsTnI results.  Refer to the Links section for chest pain algorithms and additional  guidance. Performed at [facility], [place] [address], [place], [place] [address]   POC SARS Coronavirus 2 Ag-ED - Nasal Swab (BD Veritor Kit)     Status: None   Collection Time: 08/08/19  3:47 AM  Result Value Ref Range   SARS Coronavirus 2 Ag NEGATIVE NEGATIVE    Comment: (NOTE) SARS-CoV-2 antigen NOT DETECTED.  Negative results are presumptive.  Negative results do not preclude SARS-CoV-2 infection and should not be used as the sole basis for treatment or other patient management decisions, including infection  control decisions, particularly in the presence of clinical signs and  symptoms consistent with  COVID-19, or in those who have been in contact with the virus.  Negative results must be combined with clinical observations, patient history, and epidemiological information. The expected result is Negative. Fact Sheet for Patients: PodPark.tn Fact Sheet for Healthcare Providers: GiftContent.is This test is not yet approved or cleared by the [place] FDA and  has been authorized for detection and/or diagnosis of SARS-CoV-2 by FDA under an Emergency Use Authorization (EUA).  This EUA will remain in effect (meaning this test can be used) for the duration of  the COVID-19 de claration under Section 564(b)(1) of the Act, 21 U.S.C. section 360bbb-3(b)(1), unless the authorization is terminated or revoked sooner.   Troponin I (High Sensitivity)     Status: Abnormal   Collection Time: 08/08/19  3:47 AM  Result Value Ref Range   Troponin I (High Sensitivity) 3,525 (HH) <18 ng/L    Comment: CRITICAL VALUE NOTED.  VALUE IS CONSISTENT WITH PREVIOUSLY REPORTED AND CALLED VALUE. (NOTE) Elevated high sensitivity troponin I (hsTnI) values and significant  changes across serial measurements may suggest ACS but many other  chronic and acute conditions are known to elevate hsTnI results.  Refer to the Links section for chest pain algorithms and additional  guidance. Performed at [facility], [place] [address] [place], [place] [address]   Brain natriuretic peptide     Status: Abnormal   Collection Time: 08/08/19  4:54 AM  Result Value Ref Range   B Natriuretic Peptide 469.2 (H) 0.0 - 100.0 pg/mL    Comment: Performed at [facility] [address] [place], [place] [address]   Dg Chest Port 1 View  Result Date: 08/08/2019 CLINICAL DATA:  Chest pain and shortness of breath EXAM: PORTABLE CHEST 1 VIEW COMPARISON:  None. FINDINGS: Cardiac shadow is mildly enlarged but accentuated by the portable technique. Vascular  congestion is noted centrally with mild interstitial edema consistent with congestive failure. Small right pleural effusion is noted. Bibasilar atelectatic changes are noted. IMPRESSION: Changes consistent with mild CHF with bibasilar atelectatic changes. Electronically Signed   By: Inez Catalina M.D.   On: 08/08/2019 03:27    Pending Labs Unresulted Labs (From admission, onward)    Start     Ordered   08/09/19 0500  Heparin level (unfractionated)  Daily,   R     08/08/19 0616   08/09/19 0500  CBC  Daily,   R     08/08/19 0616   08/08/19 1300  Heparin  level (unfractionated)  Once-Timed,   STAT     08/08/19 0616   08/08/19 0625  HIV Antibody (routine testing w rflx)  (HIV Antibody (Routine testing w reflex) panel)  Once,   STAT     08/08/19 0624   08/08/19 0625  Magnesium  Once,   STAT     08/08/19 0624   08/08/19 0625  TSH  Once,   STAT     08/08/19 0624   08/08/19 0625  Hemoglobin A1c  Once,   STAT     08/08/19 0624   08/08/19 0625  Brain natriuretic peptide  Once,   STAT     08/08/19 0624   08/08/19 0625  APTT  ONCE - STAT,   STAT     08/08/19 0624   08/08/19 0625  Protime-INR  ONCE - STAT,   STAT     08/08/19 0624   08/08/19 0625  Lipid panel  Once,   STAT     08/08/19 0624   08/08/19 0419  SARS CORONAVIRUS 2 (TAT 6-24 HRS) Nasopharyngeal Nasopharyngeal Swab  (Asymptomatic/Tier 3)  Once,   STAT    Question Answer Comment  Is this test for diagnosis or screening Screening   Symptomatic for COVID-19 as defined by CDC No   Hospitalized for COVID-19 No   Admitted to ICU for COVID-19 No   Previously tested for COVID-19 Yes   Resident in a congregate (group) care setting No   Employed in healthcare setting No      08/08/19 0418          Vitals/Pain Today's Vitals   08/08/19 0439 08/08/19 0500 08/08/19 0600 08/08/19 0630  BP: (!) 130/92 (!) 129/94 (!) 137/94 123/87  Pulse: (!) 112 (!) 103    Resp: 20 18 (!) 23 17  Temp:      TempSrc:      SpO2: 97% 96%    Weight:    136.1 kg   Height:   6' (1.829 m)     Isolation Precautions No active isolations  Medications Medications  aspirin EC tablet 81 mg (has no administration in time range)  nitroGLYCERIN (NITROSTAT) SL tablet 0.4 mg (has no administration in time range)  acetaminophen (TYLENOL) tablet 650 mg (has no administration in time range)  ondansetron (ZOFRAN) injection 4 mg (has no administration in time range)  atorvastatin (LIPITOR) tablet 40 mg (has no administration in time range)  heparin ADULT infusion 100 units/mL (25000 units/237m sodium chloride 0.45%) (1,400 Units/hr Intravenous New Bag/Given 08/08/19 0708)  sodium chloride flush (NS) 0.9 % injection 3 mL (3 mLs Intravenous Given 08/08/19 0437)  albuterol (VENTOLIN HFA) 108 (90 Base) MCG/ACT inhaler 8 puff (8 puffs Inhalation Given 08/08/19 0321)  methylPREDNISolone sodium succinate (SOLU-MEDROL) 125 mg/2 mL injection 125 mg (125 mg Intravenous Given 08/08/19 0322)  aspirin chewable tablet 324 mg (324 mg Oral Given 08/08/19 0456)  heparin bolus via infusion 4,000 Units (4,000 Units Intravenous Bolus from Bag 08/08/19 0400)    Mobility walks     Focused Assessments    R Recommendations: See Admitting Provider Note  Report given to:   Additional Notes:

## 2019-08-08 NOTE — Interval H&P Note (Signed)
Cath Lab Visit (complete for each Cath Lab visit)  Clinical Evaluation Leading to the Procedure:   ACS: Yes.    Non-ACS:    Anginal Classification: CCS III  Anti-ischemic medical therapy: Minimal Therapy (1 class of medications)  Non-Invasive Test Results: No non-invasive testing performed  Prior CABG: No previous CABG      History and Physical Interval Note:  08/08/2019 12:29 PM  Michael [name]  has presented today for surgery, with the diagnosis of nonstemi.  The various methods of treatment have been discussed with the patient and family. After consideration of risks, benefits and other options for treatment, the patient has consented to  Procedure(s): LEFT HEART CATH AND CORONARY ANGIOGRAPHY (N/A) as a surgical intervention.  The patient's history has been reviewed, patient examined, no change in status, stable for surgery.  I have reviewed the patient's chart and labs.  Questions were answered to the patient's satisfaction.     Belva Crome III

## 2019-08-08 NOTE — ED Notes (Signed)
Denies pain states he just has a "feeling in his chest" . States he wasn't able to walk very long at all and has to stop c/o sob. Feels like he doesn't have any strength.

## 2019-08-08 NOTE — Progress Notes (Signed)
TR BAND REMOVAL  LOCATION:    right radial  DEFLATED PER PROTOCOL:    Yes.    TIME BAND OFF / DRESSING APPLIED:    16 00  SITE UPON ARRIVAL:    Level 0  SITE AFTER BAND REMOVAL:    Level 0  CIRCULATION SENSATION AND MOVEMENT:    Within Normal Limits   Yes.    COMMENTS:   Remains unchanged remainder of shift

## 2019-08-08 NOTE — CV Procedure (Signed)
   Left heart cath, selective coronary angiography, and hemodynamic recordings via right radial approach using real-time vascular ultrasound for guidance.  Severe globally depressed LV systolic function with estimated EF 25 to 35%..  Severe elevation in LVEDP, greater than 30 mmHg consistent with acute on chronic combined systolic and diastolic heart failure.  Severe diffuse three-vessel coronary disease with patent left main.  LAD is severely and diffusely diseased from the proximal to the mid vessel.  There is a distal LAD target..  A large first diagonal is also severely and diffusely diseased in the proximal to mid segment without an obvious target for grafting.  Large ramus intermedius has luminal irregularities 30% proximal before bifurcating..  Native circumflex gives origin to 2 obtuse marginal branches.  The first obtuse marginal bifurcates and contains a 90% [place] 111 stenosis.  Proximal circumflex contains 70% narrowing.  RCA contains severe proximal to mid stenosis.  The PDA is diffusely diseased in the mid segment.  There is competitive flow from left to right collaterals.  The continuation contains right to right and left to right collateral to a small to moderate size left ventricular branch.  RECOMMENDATIONS: Discussed with Dr. Angelena Form.  IV nitroglycerin 20 mics per minute is started.  IV Lasix 40 mg is given in the Cath Lab.  IV fluid is decreased to [facility].  Needs to be watched closely for development of worsening heart failure.  Needs tuning of left ventricular heart failure prior to consideration of revascularization, preferably surgical.

## 2019-08-08 NOTE — ED Triage Notes (Signed)
Came in via EMS; c/o SOB and chest pain; stated on and off in nature but worst today.

## 2019-08-08 NOTE — Progress Notes (Signed)
Pt seen in the ED on morning rounds. Full note by overnight cardiology fellow at 7:28 am. Pt having no chest pain. EKG reviewed. Will plan cardiac cath later this am after Covid 19 test is confirmed to be negative. Questions answered.   Lauree Chandler 08/08/2019 7:45 AM

## 2019-08-08 NOTE — Progress Notes (Signed)
ANTICOAGULATION CONSULT NOTE  Pharmacy Consult for heparin Indication: chest pain/ACS  No Known Allergies  Patient Measurements: Height: 6\' 1"  (185.4 cm) Weight: (!) 318 lb 9.6 oz (144.5 kg) IBW/kg (Calculated) : 79.9 Heparin Dosing Weight: 108.7 kg   Vital Signs: Temp: 98.2 F (36.8 C) (11/27 0903) Temp Source: Oral (11/27 0903) BP: 141/80 (11/27 1342) Pulse Rate: 111 (11/27 1342)  Labs: Recent Labs    08/08/19 0147 08/08/19 0347 08/08/19 0625 08/08/19 0918  HGB 15.9  --   --   --   HCT 47.9  --   --   --   PLT 194  --   --   --   APTT  --   --  26  --   LABPROT  --   --  13.6  --   INR  --   --  1.1  --   CREATININE 1.26*  --   --   --   TROPONINIHS 1,047* 3,525* 4,988* 5,226*    Estimated Creatinine Clearance: 97.9 mL/min (A) (by C-G formula based on SCr of 1.26 mg/dL (H)).   Medical History: Past Medical History:  Diagnosis Date  . Dyspnea   . Tobacco use    Assessment: 58 yo man to start heparin for CP. He was not on anticoagulation PTA. Cath 11/27 showing multivessel dx and acute on chronic combined systolic/diastolic heart failure.    Plan to resume heparin 8 hours after sheath removal (documented at 1339). Hgb 15.9, plt 194. No s/sx of bleeding.   Goal of Therapy:  Heparin level 0.3-0.7 units/ml Monitor platelets by anticoagulation protocol: Yes   Plan:  Restart heparin infusion at 1400 units/hr on 11/27@2130  Check heparin level ~6 hours after restart Daily HL and CBC while on heparin Monitor for bleeding complications  Antonietta Jewel, PharmD, BCCCP Clinical Pharmacist  Phone: [phone]  Please check AMION for all [place] phone numbers After 10:00 PM, call [place] [phone] 08/08/2019,2:48 PM

## 2019-08-09 ENCOUNTER — Inpatient Hospital Stay (HOSPITAL_COMMUNITY): Payer: Self-pay

## 2019-08-09 ENCOUNTER — Telehealth: Payer: Self-pay | Admitting: Family Medicine

## 2019-08-09 DIAGNOSIS — R079 Chest pain, unspecified: Secondary | ICD-10-CM

## 2019-08-09 DIAGNOSIS — E1159 Type 2 diabetes mellitus with other circulatory complications: Secondary | ICD-10-CM

## 2019-08-09 DIAGNOSIS — R52 Pain, unspecified: Secondary | ICD-10-CM

## 2019-08-09 LAB — HEPARIN LEVEL (UNFRACTIONATED): Heparin Unfractionated: <0.1 IU/mL — ABNORMAL LOW (ref 0.30–0.70)

## 2019-08-09 LAB — GLUCOSE, CAPILLARY
Glucose-Capillary: 255 mg/dL — ABNORMAL HIGH (ref 70–99)
Glucose-Capillary: 244 mg/dL — ABNORMAL HIGH (ref 70–99)
Glucose-Capillary: 304 mg/dL — ABNORMAL HIGH (ref 70–99)
Glucose-Capillary: 210 mg/dL — ABNORMAL HIGH (ref 70–99)
Glucose-Capillary: 209 mg/dL — ABNORMAL HIGH (ref 70–99)

## 2019-08-09 LAB — BASIC METABOLIC PANEL
Anion gap: 13 (ref 5–15)
BUN: 22 mg/dL — ABNORMAL HIGH (ref 6–20)
CO2: 22 mmol/L (ref 22–32)
Calcium: 8.9 mg/dL (ref 8.9–10.3)
Chloride: 100 mmol/L (ref 98–111)
Creatinine, Ser: 1.26 mg/dL — ABNORMAL HIGH (ref 0.61–1.24)
GFR calc Af Amer: >60 mL/min (ref >60)
GFR calc non Af Amer: >60 mL/min (ref >60)
Glucose, Bld: 305 mg/dL — ABNORMAL HIGH (ref 70–99)
Potassium: 4.3 mmol/L (ref 3.5–5.1)
Sodium: 135 mmol/L (ref 135–145)

## 2019-08-09 LAB — CBC
HCT: 41 % (ref 39.0–52.0)
Hemoglobin: 14 g/dL (ref 13.0–17.0)
MCH: 30 pg (ref 26.0–34.0)
MCHC: 34.1 g/dL (ref 30.0–36.0)
MCV: 87.8 fL (ref 80.0–100.0)
Platelets: 192 10*3/uL (ref 150–400)
RBC: 4.67 MIL/uL (ref 4.22–5.81)
RDW: 13 % (ref 11.5–15.5)
WBC: 13.6 10*3/uL — ABNORMAL HIGH (ref 4.0–10.5)
nRBC: 0 % (ref 0.0–0.2)

## 2019-08-09 LAB — ECHOCARDIOGRAM COMPLETE
Height: 73 in
Weight: 5097.6 oz

## 2019-08-09 MED ORDER — PERFLUTREN LIPID MICROSPHERE
INTRAVENOUS | Status: AC
  Filled 2019-08-09: qty 10

## 2019-08-09 MED ORDER — ATORVASTATIN CALCIUM 80 MG PO TABS
80.0000 mg | ORAL_TABLET | Freq: Every day | ORAL | Status: DC
  Administered 2019-08-09 – 2019-08-13 (×5): 80 mg via ORAL
  Filled 2019-08-09 (×5): qty 1

## 2019-08-09 MED ORDER — PERFLUTREN LIPID MICROSPHERE
1.0000 mL | INTRAVENOUS | Status: AC | PRN
  Administered 2019-08-09: 1 mL via INTRAVENOUS
  Filled 2019-08-09: qty 10

## 2019-08-09 MED ORDER — HYDRALAZINE HCL 25 MG PO TABS
25.0000 mg | ORAL_TABLET | Freq: Two times a day (BID) | ORAL | Status: AC
  Administered 2019-08-09 – 2019-08-11 (×5): 25 mg via ORAL
  Filled 2019-08-09 (×5): qty 1

## 2019-08-09 MED ORDER — INSULIN GLARGINE 100 UNIT/ML ~~LOC~~ SOLN
12.0000 [IU] | Freq: Every day | SUBCUTANEOUS | Status: DC
  Filled 2019-08-09: qty 0.12

## 2019-08-09 MED ORDER — FUROSEMIDE 10 MG/ML IJ SOLN
40.0000 mg | Freq: Every day | INTRAMUSCULAR | Status: DC
  Administered 2019-08-09 – 2019-08-10 (×2): 40 mg via INTRAVENOUS
  Filled 2019-08-09 (×2): qty 4

## 2019-08-09 MED ORDER — CARVEDILOL 3.125 MG PO TABS
3.1250 mg | ORAL_TABLET | Freq: Two times a day (BID) | ORAL | Status: AC
  Administered 2019-08-09 – 2019-08-10 (×4): 3.125 mg via ORAL
  Filled 2019-08-09 (×3): qty 1

## 2019-08-09 MED ORDER — ENOXAPARIN SODIUM 150 MG/ML ~~LOC~~ SOLN
145.0000 mg | Freq: Two times a day (BID) | SUBCUTANEOUS | Status: DC
  Administered 2019-08-09 – 2019-08-11 (×6): 145 mg via SUBCUTANEOUS
  Filled 2019-08-09 (×6): qty 1

## 2019-08-09 MED ORDER — INSULIN GLARGINE 100 UNIT/ML ~~LOC~~ SOLN
16.0000 [IU] | Freq: Every day | SUBCUTANEOUS | Status: DC
  Administered 2019-08-09: 16 [IU] via SUBCUTANEOUS
  Filled 2019-08-09 (×3): qty 0.16

## 2019-08-09 NOTE — Consult Note (Addendum)
Family Medicine Teaching [facility] Consult Note  Service Pager: [phone]  Patient name: Michael [name] Medical record number: [id number] Date of birth: 1960-12-16 Age: 58 y.o. Gender: male  Primary Care Provider: Patient, No Pcp Per Code Status: Full Code  Preferred Emergency Contact: Michael [phone]  Chief Complaint: Chest pain   Assessment and Plan: [name] is a 58 y.o. male presenting with chest pain and dyspnea.  Patient has no known past medical history.  He has not seen a physician in over 10 years.  Type 2 diabetes mellitus Patient reports history of diabetes but has not seen a doctor in over 10 years.  On admission A1c was 10.4.  Patient was started on sliding scale insulin and 8 units of Lantus at night. Received 29u aspart last 24 hours. Will increase lantus to 16U at night as he will likely need a large increase to get tighter control, especially given his body habitus. His blood sugars throughout the day have ranged in the low 200s to 300s. Unlikely to be able to afford Lantus/aspart on discharge given lack of insurance and lack of fungs (discussed below). Can likely convert to cheaper regimen such as 70/30 once his glucose control is satisfactory. Would also benefit from metformin as outpatient. Ultimately a great candidate for SGLT-2 therapy, but will need to get financial aid/insurance coverage. -Increased to 16 units Lantus nightly. -Sensitive sliding scale -CBGs AC HS -Diabetes educator - likely convert to 70/30 once glucose control stable - metformin as outpatient - SGLT-2 inhibitor when financially feasable  Social Issues  Patient is unemployed and has no insurance.  He has not worked in over 10 years. Apparently lives off of a $100 check he gets from his brother every week. Will ask for case management/social work to assist. He should likely qualify for orange card, and can get the process for medicaid started. Hopefully he will qualify for these  programs as finances are a significant barrier to optimizing his therapy.  Patient is amenable to establishing care at [facility] for long-term management of these issues. Had frank discussion with patient that we need to improve these chronic issues or he is at high risk of disease recurrence. Will work with patient in terms of clinic costs at followups, until he is able to get the orange card. -Consulted case management   Chest pain/NSTEMI 3 vessel disease on cath. Determination between CABG vs stenting to be determied by Primary -Follow primary team's recs -PCI versus CABG decision when stabilized -ASA and Lipitor 40 mg daily -Add low-dose Coreg and monitor  Acute systolic CHF/apical thrombus Left heart cath after admission showed LVEDP 32-35 mmHg.  LVEF of 25 to 35%.  Primary team started Lasix 40 mg IV daily. On treatment dose lovenox, with eventual plan to transition to doac. -Daily weights -Check O2 sats with ambulation  Hyperlipidemia Primary team is managing and started him on Lipitor 40 mg daily. Possibility of increasing to 80 but will defer to primary team   Likely OSA Patient describes loud snoring, fatigue,. Likely has a 6 or 7 on STOP-BANG questionnaire. PA not well visualized on echo or cath by looks of report. Several family members have needed cpap in the past. Patient does not want this worked up anytime soon as he "has a lot on his plate with" his heart. Explained that this is likely playing some role in his current heart problems. Would benefit from pulm referral. Would have to be after patient is able to get some form  of insurance as outpatient.  FEN/GI: Per primary team Prophylaxis: Per primary team  Disposition: Per primary team  History of Present Illness:  Michael [name] is a 58 y.o. male presented to the hospital with chest pain and dyspnea.  He was found to have elevated troponins and admitted to cardiology.  Troponins were trending up.  The patient was taken to the Cath  Lab for a left heart cath and coronary angiography.  It showed acute on chronic combined systolic and diastolic heart failure with an EF of 25 to 35%.  The LVEDP 22 to 35 mmHg.  There was diffusely diseased proximal to mid LAD up to 90% stenosis.  Diffuse disease mid to distal LAD without focal high-grade obstruction.  First diagonal had severe proximal to mid diffuse disease, greater than 85%.  Circumflex contained diffuse 50% proximal narrowing.  First obtuse marginal is 85 to 90%.  RCA contains proximal to mid eccentric 85% stenosis.  They recommended IV nitroglycerin and IV Lasix to help treat the heart failure.  The nitro will also help improve the myocardial perfusion.  Heart failure optimization will allow consideration of revascularization.  The patient may need myocardial viability study.  Patient reports that he has had episodes of chest pain over the past 3 years since his mother died.  The first episode was right after his mother died and he said he had to lay on the floor to catch his breath.  Patient notes that this occurrence occurred after leaving Thanksgiving dinner at his family's house.  He had a long drive home and had to pull over on multiple occasions to get out of his truck to catch his breath because he was having so much chest discomfort.  Of note patient reports he is not seen a doctor because he cannot afford it.  He has no health insurance because he cannot afford it.   Review Of Systems: Per HPI with the following additions:   ROS  Patient Active Problem List   Diagnosis Date Noted  . NSTEMI (non-ST elevated myocardial infarction) (HCC) 08/08/2019  . CHF (congestive heart failure) (HCC) 08/08/2019  . Diabetes (HCC) 08/08/2019    Past Medical History: Past Medical History:  Diagnosis Date  . Dyspnea   . Tobacco use     Past Surgical History: 08/09/2019-left heart cath  Social History: Social History   Tobacco Use  . Smoking status: Not on file  . Smokeless  tobacco: Current User    Types: Chew  Substance Use Topics  . Alcohol use: Not on file  . Drug use: Not on file   Additional social history: Denies alcohol or other drug use Please also refer to relevant sections of EMR.  Family History: No family history on file. Patient reports family history of stroke in his father, hypertension in multiple family members, diabetes in multiple close relatives   Allergies and Medications: No Known Allergies No current facility-administered medications on file prior to encounter.    Current Outpatient Medications on File Prior to Encounter  Medication Sig Dispense Refill  . aspirin 325 MG tablet Take 325 mg by mouth once.      Objective: BP 132/73   Pulse 99   Temp 98.4 F (36.9 C) (Oral)   Resp 18   Ht 6\' 1"  (1.854 m)   Wt (!) 144.5 kg   SpO2 96%   BMI 42.03 kg/m  Physical Exam   Labs and Imaging: CBC BMET  Recent Labs  Lab 08/09/19 0410  WBC  13.6*  HGB 14.0  HCT 41.0  PLT 192   Recent Labs  Lab 08/09/19 0410  NA 135  K 4.3  CL 100  CO2 22  BUN 22*  CREATININE 1.26*  GLUCOSE 305*  CALCIUM 8.9    Troponins-1047 (11/27) > 3525 (11/27)> 4988> 5226 BNP-469.2> 395.3  EKG on admission showed sinus tachycardia with anterior Q waves V1-V4, no acute ischemic changes seen.  Repeat EKG 11/28 showed normal sinus rhythm   Gifford Shave, MD 08/09/2019, 12:28 PM PGY-1, [place] Intern pager: [phone], text pages welcome ---------------------------------------------------- Upper Level Addendum: I have seen and evaluated this patient along with Dr. Caron Presume and reviewed the above note, making necessary revisions in blue.  Guadalupe Dawn MD PGY-3 Family Medicine Resident

## 2019-08-09 NOTE — Progress Notes (Signed)
ANTICOAGULATION CONSULT NOTE  Pharmacy Consult for heparin Indication: chest pain/ACS  No Known Allergies  Patient Measurements: Height: 6\' 1"  (185.4 cm) Weight: (!) 318 lb 9.6 oz (144.5 kg) IBW/kg (Calculated) : 79.9 Heparin Dosing Weight: 108.7 kg   Vital Signs: Temp: 98.4 F (36.9 C) (11/28 0500) Temp Source: Oral (11/28 0500) BP: 132/73 (11/28 0510) Pulse Rate: 99 (11/28 0510)  Labs: Recent Labs    08/08/19 0147 08/08/19 0347 08/08/19 0625 08/08/19 0918 08/09/19 0410  HGB 15.9  --   --   --  14.0  HCT 47.9  --   --   --  41.0  PLT 194  --   --   --  192  APTT  --   --  26  --   --   LABPROT  --   --  13.6  --   --   INR  --   --  1.1  --   --   HEPARINUNFRC  --   --   --   --  <0.10*  CREATININE 1.26*  --   --   --  1.26*  TROPONINIHS 1,047* 3,525* 4,988* 5,226*  --     Estimated Creatinine Clearance: 97.9 mL/min (A) (by C-G formula based on SCr of 1.26 mg/dL (H)).   Medical History: Past Medical History:  Diagnosis Date  . Dyspnea   . Tobacco use    Assessment: 58 yo man to start heparin for CP. He was not on anticoagulation PTA. Cath 11/27 showing multivessel dx and acute on chronic combined systolic/diastolic heart failure.  Patient was on heparin, however, patient is very frustrated with frequent lab draws. Pharmacy has been consulted to dose enoxaparin.  Hgb is wnls at 14, plt 192 No s/sx of bleeding.   Goal of Therapy:  Monitor platelets by anticoagulation protocol: Yes   Plan:  Start enoxaparin SQ 1mg /kg q12 ~ 145 mg Q 12 hours. Consider obtaining levels once lovenox at steady state if patient is to continue on therpy Daily CBC while on enoxaparin Monitor for bleeding complications  Sherren Kerns, PharmD PGY1 [place] Resident  Please check AMION for all [place] phone numbers After 10:00 PM, call [place] [phone] 08/09/2019,10:51 AM

## 2019-08-09 NOTE — Consult Note (Signed)
[facility] Consult Note  Service Pager: [phone]  Patient name: Michael [name] Medical record number: [id number] Date of birth: 04/13/1961 Age: 58 y.o. Gender: male  Primary Care Provider: Patient, No Pcp Per Code Status: Full Code  Preferred Emergency Contact: Thomas Martinique575 [phone]  Chief Complaint: Chest pain   Assessment and Plan: Michael [name] is a 58 y.o. male presenting with chest pain and dyspnea.  Patient has no known past medical history.  He has not seen a physician in over 10 years.   Type 2 diabetes mellitus Patient reports history of diabetes but has not seen a doctor in over 10 years.  On admission A1c was 10.4.  Received 16 units of Lantus last night, 23 units NovoLog in last 24 hours.  Hoping to discharge patient with SGLT2 inhibitor given cardiac history. -Continue Lantus 16u QHS -Sensitive sliding scale -Every 4 hour CBGs -Diabetes educator  Chest pain/NSTEMI Primary team managing -Follow primary team's recs -PCI versus CABG decision when stabilized, cardiac MRI and possible CT surgery consult -ASA and Lipitor 40 mg daily -Add low-dose Coreg and monitor  Acute HFrEF/Apical Thrombus Left heart cath after admission showed LVEDP 32-35 mmHg.  LVEF of 25 to 35%.  Primary team started Lasix 40 mg IV daily.  Per cardiology, patient should be on Entresto if cost permits, when he is diuresed.  On treatment dose Lovenox, with eventual plan to transition to [place]. -Daily weights -Check O2 sats with ambulation   Hyperlipidemia Atorvastatin 80 mg per primary  Social Issues  Patient has issues with care management.  Patient is unemployed and has no insurance.  He has not worked in over 10 years.  He reports that he has attempted to get assistance and been told he does not qualify. -Casework consult   FEN/GI: Per primary team Prophylaxis: Per primary team  Disposition: Per primary team  Subjective: Patient states that he cannot get any  rest on the hospital.  Otherwise denies any complaints.  Objective: Temp:  [98.4 F (36.9 C)-99.6 F (37.6 C)] 99.6 F (37.6 C) (11/28 1923) Pulse Rate:  [95-105] 105 (11/28 1923) BP: (104-117)/(71-79) 104/75 (11/29 0204) SpO2:  [96 %] 96 % (11/28 1923)  Physical Exam:  General: 58 y.o. male in NAD Cardio: RRR no m/r/g Lungs: CTAB, no wheezing, no rhonchi, no crackles, no IWOB on RA Abdomen: Soft, non-tender to palpation, non-distended, positive bowel sounds Skin: warm and dry  Labs and Imaging: CBC BMET  Recent Labs  Lab 08/10/19 0323  WBC 10.1  HGB 13.0  HCT 38.7*  PLT 172   Recent Labs  Lab 08/09/19 0410  NA 135  K 4.3  CL 100  CO2 22  BUN 22*  CREATININE 1.26*  GLUCOSE 305*  CALCIUM 8.9    Troponins-1047 (11/27) > 3525 (11/27)> 4988> 5226 BNP-469.2> 395.3  EKG on admission showed sinus tachycardia with anterior Q waves V1-V4, no acute ischemic changes seen.  Repeat EKG 11/28 showed normal sinus rhythm   Meccariello, Bernita Raisin, DO 08/10/2019, 5:34 AM PGY-2, [place] Intern pager: [phone], text pages welcome

## 2019-08-09 NOTE — Progress Notes (Signed)
   Dr Stanford Breed reviewed echo.  EF is decreased and possible apical clot is seen.  He recommends full-dose lovenox or heparin>> DOAC.  Rosaria Ferries, PA-C 08/09/2019 4:05 PM Beeper [phone]

## 2019-08-09 NOTE — Progress Notes (Signed)
*  PRELIMINARY RESULTS* Echocardiogram 2D Echocardiogram with definity has been performed.  Michael [name] 08/09/2019, 3:18 PM

## 2019-08-09 NOTE — Progress Notes (Addendum)
Progress Note  Patient Name: Marlen Martinique Date of Encounter: 08/09/2019  Primary Cardiologist:  Lauree Chandler, MD  Subjective   Pt admits that he is irritated. Says he did not get any sleep from people coming in the room. Cannot get out of bed w/out unplugging himself, feel too much equipment on him No chest pain or SOB   Inpatient Medications    Scheduled Meds: . aspirin EC  81 mg Oral Daily  . atorvastatin  40 mg Oral q1800  . insulin aspart  0-15 Units Subcutaneous TID WC  . insulin aspart  0-5 Units Subcutaneous QHS  . insulin glargine  8 Units Subcutaneous QHS  . sodium chloride flush  3 mL Intravenous Q12H   Continuous Infusions: . sodium chloride    . heparin 1,400 Units/hr (08/08/19 2153)  . nitroGLYCERIN 20 mcg/min (08/08/19 1400)   PRN Meds: sodium chloride, acetaminophen, nitroGLYCERIN, ondansetron (ZOFRAN) IV, oxyCODONE, sodium chloride flush   Vital Signs    Vitals:   08/08/19 1342 08/08/19 1400 08/08/19 2053 08/09/19 0500  BP: (!) 141/80  136/70 132/73  Pulse: (!) 111 (!) 122 (!) 107 100  Resp: 20  20 18   Temp:   98.6 F (37 C) 98.4 F (36.9 C)  TempSrc:   Oral Oral  SpO2: 96% 94% 94% 97%  Weight:      Height:        Intake/Output Summary (Last 24 hours) at 08/09/2019 0725 Last data filed at 08/09/2019 0400 Gross per 24 hour  Intake 1236.05 ml  Output -  Net 1236.05 ml   Filed Weights   08/08/19 0600 08/08/19 0856  Weight: 136.1 kg (!) 144.5 kg   Last Weight  Most recent update: 08/08/2019  8:57 AM   Weight  144.5 kg (318 lb 9.6 oz)             Weight change: 8.437 kg   Telemetry    SR, NSVT runs, mostly short but 7.3 sec run seen last pm - Personally Reviewed  ECG    11/28 ECG is SR, HR 100 late R wave progression w/ Q waves precordial leads and flattened T waves I-AvF, some changes from 11/27 - Personally Reviewed  Physical Exam   General: Well developed, obese, male appearing in no acute distress. Head:  Normocephalic, atraumatic.  Neck: Supple without bruits, JVD not seen elevated difficult to assess 2nd body habitus. Lungs:  Resp regular and unlabored, some rales bases. Heart: RRR, S1, S2, no S3, S4, or murmur; no rub. Abdomen: Soft, non-tender, non-distended with normoactive bowel sounds. No hepatomegaly. No rebound/guarding. No obvious abdominal masses. Extremities: No clubbing, cyanosis, trace-1+ LE edema. Distal pedal pulses are 2+ bilaterally. R radial cath site w/out ecchymosis or hematoma Neuro: Alert and oriented X 3. Moves all extremities spontaneously. Psych: Normal affect.  Labs    Hematology Recent Labs  Lab 08/08/19 0147 08/09/19 0410  WBC 11.3* 13.6*  RBC 5.38 4.67  HGB 15.9 14.0  HCT 47.9 41.0  MCV 89.0 87.8  MCH 29.6 30.0  MCHC 33.2 34.1  RDW 12.9 13.0  PLT 194 192    Chemistry Recent Labs  Lab 08/08/19 0147 08/09/19 0410  NA 135 135  K 4.9 4.3  CL 103 100  CO2 20* 22  GLUCOSE 337* 305*  BUN 15 22*  CREATININE 1.26* 1.26*  CALCIUM 8.9 8.9  GFRNONAA >60 >60  GFRAA >60 >60  ANIONGAP 12 13    No results found for: ALT, AST, GGT, ALKPHOS, BILITOT  High Sensitivity Troponin:   Recent Labs  Lab 08/08/19 0147 08/08/19 0347 08/08/19 0625 08/08/19 0918  TROPONINIHS 1,047* 3,525* 4,988* 5,226*      BNP Recent Labs  Lab 08/08/19 0454 08/08/19 0625  BNP 469.2* 395.3*    Lab Results  Component Value Date   CHOL 269 (H) 08/08/2019   HDL 41 08/08/2019   LDLCALC 210 (H) 08/08/2019   TRIG 89 08/08/2019   CHOLHDL 6.6 08/08/2019   Lab Results  Component Value Date   TSH 2.400 08/08/2019   Lab Results  Component Value Date   HGBA1C 10.2 (H) 08/08/2019   Drugs of Abuse  No results found for: LABOPIA, COCAINSCRNUR, LABBENZ, AMPHETMU, Abner Greenspan    Radiology    Dg Chest Port 1 View  Result Date: 08/08/2019 CLINICAL DATA:  Chest pain and shortness of breath EXAM: PORTABLE CHEST 1 VIEW COMPARISON:  None. FINDINGS: Cardiac shadow is  mildly enlarged but accentuated by the portable technique. Vascular congestion is noted centrally with mild interstitial edema consistent with congestive failure. Small right pleural effusion is noted. Bibasilar atelectatic changes are noted. IMPRESSION: Changes consistent with mild CHF with bibasilar atelectatic changes. Electronically Signed   By: Alcide Clever M.D.   On: 08/08/2019 03:27     Cardiac Studies   CATH : 08/08/2019   Acute on chronic combined systolic and diastolic heart failure, EF 25 to 35%, with LVEDP 32 to 35 mmHg.  Widely patent left main  Diffusely diseased proximal to mid LAD up to 90% stenosis throughout the segment.  Diffuse disease mid to distal LAD without focal high-grade obstruction.  First diagonal is relatively large but contains severe proximal to mid diffuse disease, greater than 85%.  Large ramus intermedius that supplies collaterals to the distal right coronary.  30% proximal narrowing.  Circumflex contains diffuse 50% proximal narrowing.  First obtuse marginal is 85 to 90%.  Right coronary arises from a separate ostium than the conus branch.  RCA contains proximal to mid eccentric 85% stenosis.  PDA contains proximal to mid severe diffuse disease with competition from left to right collaterals.  The continuation of the right coronary beyond the PDA contains high-grade obstruction with right to right and left to right collaterals noted.  RECOMMENDATIONS:   IV nitroglycerin and IV Lasix have been ordered to help treat heart failure.  IV nitroglycerin will also serve to improve myocardial perfusion.  Heart failure optimization will allow consideration of revascularization.  The patient may need a myocardial viability study. Spoke with Dr. Clifton James.  The patient is clinically tenuous and if he does not respond to the measures outlined above, advanced heart failure service should be consulted.  Patient Profile     58 y.o. male w/ no recent physical, +FH  CAD, was admitted 11/27 w/ NSTEMI & CHF, 3 v dz at cath. +HLD, +DM on labs  Assessment & Plan    1. NSTEMI - see cath report above, PCI vs CABG decision once stabilized - on ASA and Lipitor 40 mg qd - add low-dose Coreg and see how tolerated  2. ICM - EF 35% at cath, will ck echo as well - add BB (HR generally elevated) - add Entresto as BP will allow  3. DM - A1c 10.2 - increase Lantus to 12 U qhs  4. HLD - discuss changing Lipitor 40 mg qd (new) to 80 mg qd - ck LFTs  5. Acute systolic CHF - LVEDP 32-35 mm Hg - got Lasix 40 mg IV 11/27,  will give daily for now - daily wts, ck sats w/ ambulation tomorrow  6. NSVT - no sx, follow on tele - add BB  Principal Problem:   NSTEMI (non-ST elevated myocardial infarction) [facility](HCC) Active Problems:   CHF (congestive heart failure) (HCC)   Diabetes (HCC)    Signed, Theodore Demarkhonda Barrett , PA-C 7:25 AM 08/09/2019 Pager: [phone]

## 2019-08-10 ENCOUNTER — Other Ambulatory Visit: Payer: Self-pay

## 2019-08-10 ENCOUNTER — Encounter (HOSPITAL_COMMUNITY): Payer: Self-pay | Admitting: *Deleted

## 2019-08-10 DIAGNOSIS — I5041 Acute combined systolic (congestive) and diastolic (congestive) heart failure: Secondary | ICD-10-CM

## 2019-08-10 LAB — COMPREHENSIVE METABOLIC PANEL
ALT: 30 U/L (ref 0–44)
AST: 17 U/L (ref 15–41)
Albumin: 3.3 g/dL — ABNORMAL LOW (ref 3.5–5.0)
Alkaline Phosphatase: 51 U/L (ref 38–126)
Anion gap: 9 (ref 5–15)
BUN: 22 mg/dL — ABNORMAL HIGH (ref 6–20)
CO2: 28 mmol/L (ref 22–32)
Calcium: 8.5 mg/dL — ABNORMAL LOW (ref 8.9–10.3)
Chloride: 100 mmol/L (ref 98–111)
Creatinine, Ser: 1.14 mg/dL (ref 0.61–1.24)
GFR calc Af Amer: >60 mL/min (ref >60)
GFR calc non Af Amer: >60 mL/min (ref >60)
Glucose, Bld: 165 mg/dL — ABNORMAL HIGH (ref 70–99)
Potassium: 3.5 mmol/L (ref 3.5–5.1)
Sodium: 137 mmol/L (ref 135–145)
Total Bilirubin: 0.9 mg/dL (ref 0.3–1.2)
Total Protein: 5.7 g/dL — ABNORMAL LOW (ref 6.5–8.1)

## 2019-08-10 LAB — CBC
HCT: 38.7 % — ABNORMAL LOW (ref 39.0–52.0)
Hemoglobin: 13 g/dL (ref 13.0–17.0)
MCH: 29.5 pg (ref 26.0–34.0)
MCHC: 33.6 g/dL (ref 30.0–36.0)
MCV: 88 fL (ref 80.0–100.0)
Platelets: 172 10*3/uL (ref 150–400)
RBC: 4.4 MIL/uL (ref 4.22–5.81)
RDW: 12.9 % (ref 11.5–15.5)
WBC: 10.1 10*3/uL (ref 4.0–10.5)
nRBC: 0 % (ref 0.0–0.2)

## 2019-08-10 LAB — GLUCOSE, CAPILLARY
Glucose-Capillary: 186 mg/dL — ABNORMAL HIGH (ref 70–99)
Glucose-Capillary: 176 mg/dL — ABNORMAL HIGH (ref 70–99)
Glucose-Capillary: 191 mg/dL — ABNORMAL HIGH (ref 70–99)
Glucose-Capillary: 165 mg/dL — ABNORMAL HIGH (ref 70–99)

## 2019-08-10 MED ORDER — LIVING WELL WITH DIABETES BOOK
Freq: Once | Status: AC
  Administered 2019-08-10: 16:00:00
  Filled 2019-08-10: qty 1

## 2019-08-10 MED ORDER — ISOSORBIDE MONONITRATE ER 30 MG PO TB24
15.0000 mg | ORAL_TABLET | Freq: Every day | ORAL | Status: DC
  Administered 2019-08-10 – 2019-08-14 (×5): 15 mg via ORAL
  Filled 2019-08-10 (×5): qty 1

## 2019-08-10 MED ORDER — LOSARTAN POTASSIUM 25 MG PO TABS
12.5000 mg | ORAL_TABLET | Freq: Every day | ORAL | Status: DC
  Administered 2019-08-11 – 2019-08-12 (×2): 12.5 mg via ORAL
  Filled 2019-08-10 (×2): qty 1

## 2019-08-10 MED ORDER — INSULIN GLARGINE 100 UNIT/ML ~~LOC~~ SOLN
18.0000 [IU] | Freq: Every day | SUBCUTANEOUS | Status: DC
  Administered 2019-08-10: 18 [IU] via SUBCUTANEOUS
  Filled 2019-08-10 (×2): qty 0.18

## 2019-08-10 MED ORDER — METOPROLOL SUCCINATE ER 25 MG PO TB24
25.0000 mg | ORAL_TABLET | Freq: Every day | ORAL | Status: DC
  Administered 2019-08-11 – 2019-08-14 (×4): 25 mg via ORAL
  Filled 2019-08-10 (×4): qty 1

## 2019-08-10 MED ORDER — POTASSIUM CHLORIDE CRYS ER 20 MEQ PO TBCR
20.0000 meq | EXTENDED_RELEASE_TABLET | Freq: Once | ORAL | Status: AC
  Administered 2019-08-10: 20 meq via ORAL
  Filled 2019-08-10: qty 1

## 2019-08-10 MED ORDER — FUROSEMIDE 40 MG PO TABS
40.0000 mg | ORAL_TABLET | Freq: Every day | ORAL | Status: DC
  Administered 2019-08-11 – 2019-08-14 (×4): 40 mg via ORAL
  Filled 2019-08-10 (×4): qty 1

## 2019-08-10 NOTE — Progress Notes (Addendum)
Progress Note  Patient Name: Michael [name] Date of Encounter: 08/10/2019  Primary Cardiologist:  Verne Carrow, MD  Subjective   Still not getting the rest he needs. The IV beeps and people coming in. No chest pain or SOB. Has been having LE edema, minor, like it is now. This is his baseline.   Inpatient Medications    Scheduled Meds: . aspirin EC  81 mg Oral Daily  . atorvastatin  80 mg Oral q1800  . carvedilol  3.125 mg Oral BID WC  . enoxaparin (LOVENOX) injection  145 mg Subcutaneous Q12H  . furosemide  40 mg Intravenous Daily  . hydrALAZINE  25 mg Oral BID  . insulin aspart  0-15 Units Subcutaneous TID WC  . insulin aspart  0-5 Units Subcutaneous QHS  . insulin glargine  16 Units Subcutaneous QHS  . potassium chloride  20 mEq Oral Once  . sodium chloride flush  3 mL Intravenous Q12H   Continuous Infusions: . sodium chloride    . nitroGLYCERIN 20 mcg/min (08/08/19 1400)   PRN Meds: sodium chloride, acetaminophen, nitroGLYCERIN, ondansetron (ZOFRAN) IV, oxyCODONE, sodium chloride flush   Vital Signs    Vitals:   08/10/19 0204 08/10/19 0640 08/10/19 0651 08/10/19 0746  BP: 104/75 (!) 86/73 105/71 105/64  Pulse:  87 97 (!) 103  Resp:    18  Temp:  98.2 F (36.8 C)  97.8 F (36.6 C)  TempSrc:  Oral  Oral  SpO2:  97% 94% 92%  Weight:  (!) 141.3 kg    Height:        Intake/Output Summary (Last 24 hours) at 08/10/2019 0802 Last data filed at 08/10/2019 0740 Gross per 24 hour  Intake 1249 ml  Output -  Net 1249 ml   Filed Weights   08/08/19 0600 08/08/19 0856 08/10/19 0640  Weight: 136.1 kg (!) 144.5 kg (!) 141.3 kg   Last Weight  Most recent update: 08/10/2019  6:48 AM   Weight  141.3 kg (311 lb 9.6 oz)             Weight change: -3.175 kg   Telemetry    SR, no sig ectopy - Personally Reviewed  ECG    11/28 ECG is SR, HR 100 late R wave progression w/ Q waves precordial leads and flattened T waves I-AvF, some changes from 11/27 -  Personally Reviewed  Physical Exam   General: Well developed, well nourished, male in no acute distress Head: Eyes PERRLA, Head normocephalic and atraumatic Lungs: rales bases bilaterally to auscultation. Heart: HRRR S1 S2, without rub or gallop. No murmur. 4/4 extremity pulses are 2+ & equal. No JVD seen, difficult to assess 2nd body habitus Abdomen: Bowel sounds are present, abdomen soft and non-tender without masses or  hernias noted. Msk: Normal strength and tone for age. Extremities: No clubbing, cyanosis, 1+ LE edema.    Skin:  No rashes or lesions noted. Neuro: Alert and oriented X 3. Psych:  Good affect, responds appropriately  Labs    Hematology Recent Labs  Lab 08/08/19 0147 08/09/19 0410 08/10/19 0323  WBC 11.3* 13.6* 10.1  RBC 5.38 4.67 4.40  HGB 15.9 14.0 13.0  HCT 47.9 41.0 38.7*  MCV 89.0 87.8 88.0  MCH 29.6 30.0 29.5  MCHC 33.2 34.1 33.6  RDW 12.9 13.0 12.9  PLT 194 192 172    Chemistry Recent Labs  Lab 08/08/19 0147 08/09/19 0410 08/10/19 0323  NA 135 135 137  K 4.9 4.3 3.5  CL  103 100 100  CO2 20* 22 28  GLUCOSE 337* 305* 165*  BUN 15 22* 22*  CREATININE 1.26* 1.26* 1.14  CALCIUM 8.9 8.9 8.5*  PROT  --   --  5.7*  ALBUMIN  --   --  3.3*  AST  --   --  17  ALT  --   --  30  ALKPHOS  --   --  51  BILITOT  --   --  0.9  GFRNONAA >60 >60 >60  GFRAA >60 >60 >60  ANIONGAP 12 13 9     Lab Results  Component Value Date   ALT 30 08/10/2019   AST 17 08/10/2019   ALKPHOS 51 08/10/2019   BILITOT 0.9 08/10/2019     High Sensitivity Troponin:   Recent Labs  Lab 08/08/19 0147 08/08/19 0347 08/08/19 0625 08/08/19 0918  TROPONINIHS 1,047* 3,525* 4,988* 5,226*      BNP Recent Labs  Lab 08/08/19 0454 08/08/19 0625  BNP 469.2* 395.3*    Lab Results  Component Value Date   CHOL 269 (H) 08/08/2019   HDL 41 08/08/2019   LDLCALC 210 (H) 08/08/2019   TRIG 89 08/08/2019   CHOLHDL 6.6 08/08/2019   Lab Results  Component Value Date    TSH 2.400 08/08/2019   Lab Results  Component Value Date   HGBA1C 10.2 (H) 08/08/2019     Radiology    Dg Chest Port 1 View  Result Date: 08/08/2019 CLINICAL DATA:  Chest pain and shortness of breath EXAM: PORTABLE CHEST 1 VIEW COMPARISON:  None. FINDINGS: Cardiac shadow is mildly enlarged but accentuated by the portable technique. Vascular congestion is noted centrally with mild interstitial edema consistent with congestive failure. Small right pleural effusion is noted. Bibasilar atelectatic changes are noted. IMPRESSION: Changes consistent with mild CHF with bibasilar atelectatic changes. Electronically Signed   By: Inez Catalina M.D.   On: 08/08/2019 03:27     Cardiac Studies   ECHO: 08/09/2019  1. Left ventricular ejection fraction, by visual estimation, is 25 to 30%. The left ventricle has severely decreased function. There is no left ventricular hypertrophy.  2. Definity contrast agent was given IV to delineate the left ventricular endocardial borders.  3. Left ventricular diastolic parameters are indeterminate.  4. Global right ventricle has normal systolic function.The right ventricular size is normal.  5. Left atrial size was normal.  6. Right atrial size was normal.  7. The mitral valve is normal in structure. Trace mitral valve regurgitation. No evidence of mitral stenosis.  8. The tricuspid valve is normal in structure. Tricuspid valve regurgitation is not demonstrated.  9. The aortic valve was not well visualized. Aortic valve regurgitation is not visualized. No evidence of aortic valve sclerosis or stenosis. 10. The pulmonic valve was not well visualized. Pulmonic valve regurgitation is not visualized. 11. The inferior vena cava is normal in size with greater than 50% respiratory variability, suggesting right atrial pressure of 3 mmHg. 12. Definity used; severe global reduction in LV systolic function; probable small apical thrombus.  CATH : 08/08/2019   Acute on  chronic combined systolic and diastolic heart failure, EF 25 to 35%, with LVEDP 32 to 35 mmHg.  Widely patent left main  Diffusely diseased proximal to mid LAD up to 90% stenosis throughout the segment.  Diffuse disease mid to distal LAD without focal high-grade obstruction.  First diagonal is relatively large but contains severe proximal to mid diffuse disease, greater than 85%.  Large ramus intermedius that  supplies collaterals to the distal right coronary.  30% proximal narrowing.  Circumflex contains diffuse 50% proximal narrowing.  First obtuse marginal is 85 to 90%.  Right coronary arises from a separate ostium than the conus branch.  RCA contains proximal to mid eccentric 85% stenosis.  PDA contains proximal to mid severe diffuse disease with competition from left to right collaterals.  The continuation of the right coronary beyond the PDA contains high-grade obstruction with right to right and left to right collaterals noted.  RECOMMENDATIONS:   IV nitroglycerin and IV Lasix have been ordered to help treat heart failure.  IV nitroglycerin will also serve to improve myocardial perfusion.  Heart failure optimization will allow consideration of revascularization.  The patient may need a myocardial viability study. Spoke with Dr. Clifton JamesMcAlhany.  The patient is clinically tenuous and if he does not respond to the measures outlined above, advanced heart failure service should be consulted.  Patient Profile     58 y.o. male w/ no recent physical, +FH CAD, was admitted 11/27 w/ NSTEMI & CHF, 3 v dz at cath. +HLD, +DM on labs  Assessment & Plan    1. NSTEMI - cath report above, cardiac MRI in am to assess viability, then decide on TCTS consult - continue ASA, Lipitor 80 mg qd and low-dose BB - SBP 100-110, no dose change  2. ICM - EF 35% at cath, 25% at echo - BB and hydralazine added, do not think BP will tolerate Entresto - can change IV nitro to Imdur 15 mg qd, increase to 30 mg if  tolerated  3. DM - FM seeing and managing  4. HLD - on Lipitor 80 mg qd - LFTs are ok  5. Acute systolic CHF - LVEDP 32-35 mmHg at cath - some extra volume on exam - RA pressure 3 on echo - no output recorded  - wt down 7 lbs since admit - renal function stable - continue IV Lasix 40 mg qd  6. NSVT - none in > 24 hr - continue BB  Principal Problem:   NSTEMI (non-ST elevated myocardial infarction) [facility](HCC) Active Problems:   CHF (congestive heart failure) (HCC)   Diabetes (HCC)    SignedTheodore Demark, Corda Shutt , PA-C 8:02 AM 08/10/2019 Pager: [phone]

## 2019-08-10 NOTE — Progress Notes (Signed)
Patient requiring 9U aspart last 24 hours after getting 16U lantus overnight on 11/28. Will increase to 18U lantus for this pm.  Guadalupe Dawn MD PGY-3 Family Medicine Resident

## 2019-08-10 NOTE — Progress Notes (Signed)
Inpatient Diabetes Program Recommendations  AACE/ADA: New Consensus Statement on Inpatient Glycemic Control (2015)  Target Ranges:  Prepandial:   less than 140 mg/dL      Peak postprandial:   less than 180 mg/dL (1-2 hours)      Critically ill patients:  140 - 180 mg/dL   Lab Results  Component Value Date   GLUCAP 176 (H) 08/10/2019   HGBA1C 10.2 (H) 08/08/2019    Review of Glycemic Control Results for Michael [name], Michael [name] (MRN [id number]) as of 08/10/2019 13:19  Ref. Range 08/09/2019 21:18 08/10/2019 07:25 08/10/2019 11:24  Glucose-Capillary Latest Ref Range: 70 - 99 mg/dL 209 (H) 186 (H) 176 (H)   Diabetes history: DM 2 (has not seen dr. In 10 years) Outpatient Diabetes medications: None Current orders for Inpatient glycemic control:  Novolog moderate tid with meals and HS Lantus 16 units q HS  Inpatient Diabetes Program Recommendations:    Referral received.  Ordered Living well with DM booklet for patient.  Diabetes coordinator will see patient on 11/30.  Thanks,  Adah Perl, RN, BC-ADM Inpatient Diabetes Coordinator Pager [phone] (8a-5p)

## 2019-08-10 NOTE — Telephone Encounter (Signed)
Entered in error  Hajime Asfaw MD PGY-3 Family Medicine Resident 

## 2019-08-11 ENCOUNTER — Inpatient Hospital Stay (HOSPITAL_COMMUNITY): Payer: Self-pay

## 2019-08-11 DIAGNOSIS — I5021 Acute systolic (congestive) heart failure: Secondary | ICD-10-CM | POA: Insufficient documentation

## 2019-08-11 DIAGNOSIS — I5023 Acute on chronic systolic (congestive) heart failure: Secondary | ICD-10-CM

## 2019-08-11 DIAGNOSIS — I5041 Acute combined systolic (congestive) and diastolic (congestive) heart failure: Secondary | ICD-10-CM

## 2019-08-11 LAB — CBC
HCT: 41.6 % (ref 39.0–52.0)
Hemoglobin: 14 g/dL (ref 13.0–17.0)
MCH: 29.5 pg (ref 26.0–34.0)
MCHC: 33.7 g/dL (ref 30.0–36.0)
MCV: 87.8 fL (ref 80.0–100.0)
Platelets: 170 10*3/uL (ref 150–400)
RBC: 4.74 MIL/uL (ref 4.22–5.81)
RDW: 12.7 % (ref 11.5–15.5)
WBC: 9.9 10*3/uL (ref 4.0–10.5)
nRBC: 0 % (ref 0.0–0.2)

## 2019-08-11 LAB — GLUCOSE, CAPILLARY
Glucose-Capillary: 170 mg/dL — ABNORMAL HIGH (ref 70–99)
Glucose-Capillary: 157 mg/dL — ABNORMAL HIGH (ref 70–99)
Glucose-Capillary: 178 mg/dL — ABNORMAL HIGH (ref 70–99)
Glucose-Capillary: 129 mg/dL — ABNORMAL HIGH (ref 70–99)

## 2019-08-11 MED ORDER — INSULIN STARTER KIT- PEN NEEDLES (ENGLISH)
1.0000 | Freq: Once | Status: AC
  Administered 2019-08-11: 1
  Filled 2019-08-11: qty 1

## 2019-08-11 MED ORDER — LORAZEPAM 2 MG/ML IJ SOLN
INTRAMUSCULAR | Status: AC
  Filled 2019-08-11: qty 1

## 2019-08-11 MED ORDER — LORAZEPAM 2 MG/ML IJ SOLN
1.0000 mg | Freq: Once | INTRAMUSCULAR | Status: DC
  Filled 2019-08-11: qty 1

## 2019-08-11 MED ORDER — SPIRONOLACTONE 12.5 MG HALF TABLET
12.5000 mg | ORAL_TABLET | Freq: Every day | ORAL | Status: DC
  Administered 2019-08-11 – 2019-08-12 (×2): 12.5 mg via ORAL
  Filled 2019-08-11 (×3): qty 1

## 2019-08-11 MED ORDER — GADOBUTROL 1 MMOL/ML IV SOLN
10.0000 mL | Freq: Once | INTRAVENOUS | Status: AC | PRN
  Administered 2019-08-11: 10 mL via INTRAVENOUS

## 2019-08-11 MED ORDER — INSULIN GLARGINE 100 UNIT/ML ~~LOC~~ SOLN
20.0000 [IU] | Freq: Every day | SUBCUTANEOUS | Status: DC
  Administered 2019-08-11 – 2019-08-13 (×3): 20 [IU] via SUBCUTANEOUS
  Filled 2019-08-11 (×4): qty 0.2

## 2019-08-11 NOTE — TOC Initial Note (Signed)
Transition of Care [facility]) - Initial/Assessment Note    Patient Details  Name: Michael [name] MRN: [id number] Date of Birth: August 25, 1961  Transition of Care [facility]) CM/SW Contact:    Bethena Roys, RN Phone Number: 08/11/2019, 4:01 PM  Clinical Narrative:  Pt presented for Nstemi and Heart Failure. Pt is without PCP at this time and he has no insurance. 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 Patient has support of his brother that gives him $100.00 a week for monthly expenses. Patient is from [place] notified Financial Counselor and discussed with Charlena Cross FC to see if the patient would be appropriate for Medicaid- she will speak with patient and start the application to see if eligible. The [facility] will be in contact with the patient as well. Medicaid/Disability can take over 90 days if the person is eligible. The Diabetes Educator has met with the patient to discuss a cheaper option for Diabetes Management besides the [facility]. Still not sure if the patient can afford the insulin with the limited amount of money that has. CM will schedule a hospital follow up appointment-then place on AVS. Patient will be able to utilize the [facility] and [place] for medication assistance ranging in cost from $4.00-$10.00. Patient will need his initial prescriptions sent to [place] may need to [facility] the patient. We discussed transportation and his truck is older-and needs some repairs-patient states if he can get gas money he can hopefully get to appointments. CM will continue to follow for transition of care needs.            Expected Discharge Plan: Home/Self Care Barriers to Discharge: Continued Medical Work up   Patient Goals and CMS Choice Patient states their goals for this hospitalization and ongoing recovery are:: "to get better and get some assistance"   Choice offered to / list presented to :  NA  Expected Discharge Plan and Services Expected Discharge Plan: Home/Self Care In-house Referral: Financial Counselor(CM called Financial Counselor spoke with Charlena Cross- She will call patient concerning Medicaid/Disability- Will also call the [facility]. Diabetes Educator consulted for new diagnosis) Discharge Planning Services: CM Consult, [facility], Follow-up appt scheduled, Medication Assistance Post Acute Care Choice: NA Living arrangements for the past 2 months: Mobile Home(previously his mothers)                                      Prior Living Arrangements/Services Living arrangements for the past 2 months: Mobile Home(previously his mothers) Lives with:: Self Patient language and need for interpreter reviewed:: Yes Do you feel safe going back to the place where you live?: Yes      Need for Family Participation in Patient Care: Yes (Comment) Care giver support system in place?: Yes (comment)   Criminal Activity/Legal Involvement Pertinent to Current Situation/Hospitalization: No - Comment as needed  Activities of Daily Living Home Assistive Devices/Equipment: None ADL Screening (condition at time of admission) Patient's cognitive ability adequate to safely complete daily activities?: Yes Is the patient deaf or have difficulty hearing?: No Does the patient have difficulty seeing, even when wearing glasses/contacts?: No Does the patient have difficulty concentrating, remembering, or making decisions?: No Patient able to express need for assistance with ADLs?: Yes Does the patient have difficulty dressing or bathing?: No Independently performs ADLs?: Yes (appropriate for developmental age) Does the  patient have difficulty walking or climbing stairs?: No Weakness of Legs: None Weakness of Arms/Hands: None  Permission Sought/Granted Permission sought to share information with : Family Supports, Chartered certified accountant granted to share  information with : Yes, Verbal Permission Granted              Emotional Assessment Appearance:: Appears stated age Attitude/Demeanor/Rapport: Engaged Affect (typically observed): Accepting Orientation: : Oriented to Situation, Oriented to  Time, Oriented to Place, Oriented to Self Alcohol / Substance Use: Not Applicable Psych Involvement: No (comment)  Admission diagnosis:  Pain [R52] NSTEMI (non-ST elevated myocardial infarction) [facility]) [I21.4] Patient Active Problem List   Diagnosis Date Noted  . Acute on chronic systolic heart failure ([place])   . NSTEMI (non-ST elevated myocardial infarction) ([place]) 08/08/2019  . CHF (congestive heart failure) ([place]) 08/08/2019  . Diabetes ([place]) 08/08/2019   PCP:  Patient, No Pcp Per Pharmacy:   [id number] [id number] [id number]: [phone]: [phone]    Social Determinants of Health (SDOH) Interventions    Readmission Risk Interventions No flowsheet data found.

## 2019-08-11 NOTE — Progress Notes (Addendum)
Inpatient Diabetes Program Recommendations  AACE/ADA: New Consensus Statement on Inpatient Glycemic Control (2015)  Target Ranges:  Prepandial:   less than 140 mg/dL      Peak postprandial:   less than 180 mg/dL (1-2 hours)      Critically ill patients:  140 - 180 mg/dL   Lab Results  Component Value Date   GLUCAP 170 (H) 08/11/2019   HGBA1C 10.2 (H) 08/08/2019    Review of Glycemic Control  Results for [name], Michael (MRN [id number]) as of 08/11/2019 11:46  Ref. Range 08/10/2019 16:04 08/10/2019 20:58 08/11/2019 03:39 08/11/2019 08:04  Glucose-Capillary Latest Ref Range: 70 - 99 mg/dL 191 (H) 3 units novolog 165 (H) 18u lantus  170 (H) 3 units novolog    Diabetes history: DM2 Outpatient Diabetes medications: None Current orders for Inpatient glycemic control: Lantus 18 units QD + Novolog 0-15u TID with meals and 0-5u HS  Inpatient Diabetes Program Recommendations:     NOTE-No insurance; No PCP  -Novolin ReliOn ([employer] brand) 70/30 13 units BID if discharging on insulin  This DM coordinator to patients room to teach; patient currently in cath lab.  Will see patient this afternoon.     _0 -Spoke with patient at bedside.  He states he has never been told he has DM; he states it runs in his family.  Current A1c is 10.2.  Explained what an A1c is, goal range and long term effects.  Has Living Well with DM at bedside.  Educated patient on insulin administration and he return demonstrated without difficulty.  Discussed with patient and RN to administer insulin to self.  Patient does drink regular soda and eats a lot of pasta and bread.  Educated on diet and changing to diet drinks.  He states he can adjust.  Educated patient on hypoglycemia and how to treat.  Patient states he would prefer insulin pens.  CM is working with patient and financial counseling attempting to get Medicaid/disablity.  Attached education on exit care for discharge.  Ordered insulin pen starter kit.  Will  continue to follow while inpatient.     Thank you, Geoffry Paradise, RN, BSN Diabetes Coordinator Inpatient Diabetes Program [phone] (team pager from 8a-5p)

## 2019-08-11 NOTE — Consult Note (Addendum)
[facility] Consult Note  Service Pager: [phone]  Patient name: Michael [name] Medical record number: [id number] Date of birth: 01-06-1961 Age: 58 y.o. Gender: male  Primary Care Provider: Patient, No Pcp Per Code Status: Full Code  Preferred Emergency Contact: Michael [phone]  Chief Complaint: Chest pain   Assessment and Plan: Michael [name] is a 58 y.o. male presenting with chest pain and dyspnea.  Patient has no known past medical history.  He has not seen a physician in over 10 years.   Type 2 diabetes mellitus Patient reports history of diabetes but has not seen a doctor in over 10 years.  On admission A1c was 10.4.  Received 18 units of Lantus last night, 9  units NovoLog in last 24 hours. Fasting glucose 170 this morning. Hoping to discharge patient with SGLT2 inhibitor given cardiac history. -Continue Lantus 18u QHS -Moderate sliding scale -Every 4 hour CBGs -Diabetes educator  Chest pain/NSTEMI Primary team managing -Follow primary team's recs -PCI versus CABG decision when stabilized, cardiac MRI and possible CT surgery consult -ASA and Lipitor 40 mg daily -Switched to Toprol  Acute HFrEF/Apical Thrombus Left heart cath after admission showed LVEDP 32-35 mmHg.  LVEF of 25 to 35%.  Primary team started Lasix 40 mg IV daily.  Per cardiology, patient should be on Entresto if cost permits, when he is diuresed.  On treatment dose Lovenox, with eventual plan to transition to [place]. -Daily weights -Check O2 sats with ambulation - Start low dose Aldactone  -Continue Losartan    Hyperlipidemia Atorvastatin 80 mg per primary  Social Issues  Patient has issues with care management.  Patient is unemployed and has no insurance.  He has not worked in over 10 years.  He reports that he has attempted to get assistance and been told he does not qualify. -Casework consult   FEN/GI: Per primary team Prophylaxis: Per primary team  Disposition: Per  primary team  Subjective: Michael [name] states "I feel fine."   Objective: Temp:  [97.5 F (36.4 C)-98 F (36.7 C)] 98 F (36.7 C) (11/30 0648) Pulse Rate:  [91-105] 91 (11/30 0648) Resp:  [18] 18 (11/29 1609) BP: (112-130)/(65-92) 124/81 (11/30 0648) SpO2:  [96 %-99 %] 99 % (11/30 0648) Weight:  [139.3 kg] 139.3 kg (11/30 3662)  Physical Exam:  GEN: obese male sitting in chair watching television, in no distress CV: regular rate and rhythm, no murmurs appreciated  RESP: no increased work of breathing, clear to ascultation bilaterally ABD:  Obese, soft, nontender, nondistended.  MSK: moves all extremities appropriately, no cyanosis noted NEURO: normal speech, alert and oriented    Labs and Imaging: CBC BMET  Recent Labs  Lab 08/11/19 0339  WBC 9.9  HGB 14.0  HCT 41.6  PLT 170   Recent Labs  Lab 08/10/19 0323  NA 137  K 3.5  CL 100  CO2 28  BUN 22*  CREATININE 1.14  GLUCOSE 165*  CALCIUM 8.5*    Troponins-1047 (11/27) > 3525 (11/27)> 4988> 5226 BNP-469.2> 395.3  EKG on admission showed sinus tachycardia with anterior Q waves V1-V4, no acute ischemic changes seen.  Repeat EKG 11/28 showed normal sinus rhythm   Lyndee Hensen, MD 08/11/2019, 8:24 AM PGY-1, [place] Intern pager: [phone], text pages welcome

## 2019-08-11 NOTE — Progress Notes (Signed)
Progress Note  Patient Name: Michael [name] Date of Encounter: 08/11/2019  Primary Cardiologist: Verne Carrow, MD   Subjective   Sitting up in chair watching TV.   Inpatient Medications    Scheduled Meds: . aspirin EC  81 mg Oral Daily  . atorvastatin  80 mg Oral q1800  . enoxaparin (LOVENOX) injection  145 mg Subcutaneous Q12H  . furosemide  40 mg Oral Daily  . insulin aspart  0-15 Units Subcutaneous TID WC  . insulin aspart  0-5 Units Subcutaneous QHS  . insulin glargine  18 Units Subcutaneous QHS  . isosorbide mononitrate  15 mg Oral Daily  . losartan  12.5 mg Oral Daily  . metoprolol succinate  25 mg Oral Daily  . sodium chloride flush  3 mL Intravenous Q12H   Continuous Infusions: . sodium chloride     PRN Meds: sodium chloride, acetaminophen, nitroGLYCERIN, ondansetron (ZOFRAN) IV, oxyCODONE, sodium chloride flush   Vital Signs    Vitals:   08/10/19 1609 08/10/19 1620 08/10/19 2042 08/11/19 0648  BP: (!) 130/92 (!) 130/92  124/81  Pulse: (!) 102 (!) 105  91  Resp: 18     Temp: 97.7 F (36.5 C)  (!) 97.5 F (36.4 C) 98 F (36.7 C)  TempSrc: Oral  Oral Oral  SpO2: 98%   99%  Weight:    (!) 139.3 kg  Height:        Intake/Output Summary (Last 24 hours) at 08/11/2019 0859 Last data filed at 08/11/2019 0742 Gross per 24 hour  Intake 857.45 ml  Output 1350 ml  Net -492.55 ml   Last 3 Weights 08/11/2019 08/10/2019 08/08/2019  Weight (lbs) 307 lb 311 lb 9.6 oz 318 lb 9.6 oz  Weight (kg) 139.254 kg 141.341 kg 144.516 kg      Telemetry    ST - Personally Reviewed  ECG    No new tracing this morning.  Physical Exam  WM, sitting up in chair.  GEN: No acute distress.   Neck: No JVD Cardiac: ST, no murmurs, rubs, or gallops.  Respiratory: Clear to auscultation bilaterally. GI: Soft, nontender, non-distended  MS: 1+ bilateral LE edema; No deformity. Right radial site stable.  Neuro:  Nonfocal  Psych: Normal affect   Labs    High  Sensitivity Troponin:   Recent Labs  Lab 08/08/19 0147 08/08/19 0347 08/08/19 0625 08/08/19 0918  TROPONINIHS 1,047* 3,525* 4,988* 5,226*      Chemistry Recent Labs  Lab 08/08/19 0147 08/09/19 0410 08/10/19 0323  NA 135 135 137  K 4.9 4.3 3.5  CL 103 100 100  CO2 20* 22 28  GLUCOSE 337* 305* 165*  BUN 15 22* 22*  CREATININE 1.26* 1.26* 1.14  CALCIUM 8.9 8.9 8.5*  PROT  --   --  5.7*  ALBUMIN  --   --  3.3*  AST  --   --  17  ALT  --   --  30  ALKPHOS  --   --  51  BILITOT  --   --  0.9  GFRNONAA >60 >60 >60  GFRAA >60 >60 >60  ANIONGAP 12 13 9      Hematology Recent Labs  Lab 08/09/19 0410 08/10/19 0323 08/11/19 0339  WBC 13.6* 10.1 9.9  RBC 4.67 4.40 4.74  HGB 14.0 13.0 14.0  HCT 41.0 38.7* 41.6  MCV 87.8 88.0 87.8  MCH 30.0 29.5 29.5  MCHC 34.1 33.6 33.7  RDW 13.0 12.9 12.7  PLT 192 172 170  BNP Recent Labs  Lab 08/08/19 0454 08/08/19 0625  BNP 469.2* 395.3*     DDimer No results for input(s): DDIMER in the last 168 hours.   Radiology    No results found.  Cardiac Studies   Cath: 08/08/19   Acute on chronic combined systolic and diastolic heart failure, EF 25 to 35%, with LVEDP 32 to 35 mmHg.  Widely patent left main  Diffusely diseased proximal to mid LAD up to 90% stenosis throughout the segment.  Diffuse disease mid to distal LAD without focal high-grade obstruction.  First diagonal is relatively large but contains severe proximal to mid diffuse disease, greater than 85%.  Large ramus intermedius that supplies collaterals to the distal right coronary.  30% proximal narrowing.  Circumflex contains diffuse 50% proximal narrowing.  First obtuse marginal is 85 to 90%.  Right coronary arises from a separate ostium than the conus branch.  RCA contains proximal to mid eccentric 85% stenosis.  PDA contains proximal to mid severe diffuse disease with competition from left to right collaterals.  The continuation of the right coronary beyond  the PDA contains high-grade obstruction with right to right and left to right collaterals noted.  RECOMMENDATIONS:   IV nitroglycerin and IV Lasix have been ordered to help treat heart failure.  IV nitroglycerin will also serve to improve myocardial perfusion.  Heart failure optimization will allow consideration of revascularization.  The patient may need a myocardial viability study.  Spoke with Dr. Clifton JamesMcAlhany.  The patient is clinically tenuous and if he does not respond to the measures outlined above, advanced heart failure service should be consulted.  Diagnostic Dominance: Right   TTE: 08/09/19  IMPRESSIONS    1. Left ventricular ejection fraction, by visual estimation, is 25 to 30%. The left ventricle has severely decreased function. There is no left ventricular hypertrophy.  2. Definity contrast agent was given IV to delineate the left ventricular endocardial borders.  3. Left ventricular diastolic parameters are indeterminate.  4. Global right ventricle has normal systolic function.The right ventricular size is normal.  5. Left atrial size was normal.  6. Right atrial size was normal.  7. The mitral valve is normal in structure. Trace mitral valve regurgitation. No evidence of mitral stenosis.  8. The tricuspid valve is normal in structure. Tricuspid valve regurgitation is not demonstrated.  9. The aortic valve was not well visualized. Aortic valve regurgitation is not visualized. No evidence of aortic valve sclerosis or stenosis. 10. The pulmonic valve was not well visualized. Pulmonic valve regurgitation is not visualized. 11. The inferior vena cava is normal in size with greater than 50% respiratory variability, suggesting right atrial pressure of 3 mmHg. 12. Definity used; severe global reduction in LV systolic function; probable small apical thrombus.  Patient Profile     58 y.o. male with no reported PMH though had not seen PCP in >10 years who presented with  chest pain and found to have elevated troponin. Underwent cath noted above.   Assessment & Plan    1. NSTEMI: peaked at 5226. Underwent cardiac cath noted above with severe 2v disease. On ASA, statin, BB. Planned for viability study today, then CT surgery consult if appropriate.   2. Acute combine HF: EF noted at 25-30%. LVEDP 35 on cath. Given several dose of IV lasix. Switched from coreg to metoprolol for better HR control. On losartan as well. Lasix 40mg  daily. Renal function is now much improved.   3. Apical Thrombus: now treated with lovenox as  he did not like freq labs draws with IV heparin. Ideally would treat with coumadin.   4. DM: Hgb A1c 10.2. Family med consulted and following. On SSI, and lantus. Consider SGLT2 closer to discharge.   5. HL: LDL 210. Started on high dose statin.  6. Social issues: reports significant financial issues. No insurance, and feels very overwhelmed with situation.    For questions or updates, please contact [place] Please consult [email or web address] for contact info under     Signed, Reino Bellis, NP  08/11/2019, 8:59 AM

## 2019-08-11 NOTE — Progress Notes (Signed)
Patient is required 9 units aspart last 24 hours.  We will increase Lantus to 20 units nightly.  Guadalupe Dawn MD PGY-3 Family Medicine Resident

## 2019-08-12 ENCOUNTER — Other Ambulatory Visit: Payer: Self-pay | Admitting: *Deleted

## 2019-08-12 DIAGNOSIS — I255 Ischemic cardiomyopathy: Secondary | ICD-10-CM

## 2019-08-12 DIAGNOSIS — I25110 Atherosclerotic heart disease of native coronary artery with unstable angina pectoris: Secondary | ICD-10-CM

## 2019-08-12 DIAGNOSIS — I5021 Acute systolic (congestive) heart failure: Secondary | ICD-10-CM

## 2019-08-12 DIAGNOSIS — I214 Non-ST elevation (NSTEMI) myocardial infarction: Secondary | ICD-10-CM

## 2019-08-12 DIAGNOSIS — I2510 Atherosclerotic heart disease of native coronary artery without angina pectoris: Secondary | ICD-10-CM

## 2019-08-12 LAB — BASIC METABOLIC PANEL
Anion gap: 14 (ref 5–15)
BUN: 15 mg/dL (ref 6–20)
CO2: 22 mmol/L (ref 22–32)
Calcium: 8.6 mg/dL — ABNORMAL LOW (ref 8.9–10.3)
Chloride: 101 mmol/L (ref 98–111)
Creatinine, Ser: 1 mg/dL (ref 0.61–1.24)
GFR calc Af Amer: >60 mL/min (ref >60)
GFR calc non Af Amer: >60 mL/min (ref >60)
Glucose, Bld: 143 mg/dL — ABNORMAL HIGH (ref 70–99)
Potassium: 3.6 mmol/L (ref 3.5–5.1)
Sodium: 137 mmol/L (ref 135–145)

## 2019-08-12 LAB — CBC
HCT: 41.6 % (ref 39.0–52.0)
Hemoglobin: 13.8 g/dL (ref 13.0–17.0)
MCH: 29.4 pg (ref 26.0–34.0)
MCHC: 33.2 g/dL (ref 30.0–36.0)
MCV: 88.5 fL (ref 80.0–100.0)
Platelets: 152 10*3/uL (ref 150–400)
RBC: 4.7 MIL/uL (ref 4.22–5.81)
RDW: 12.7 % (ref 11.5–15.5)
WBC: 8.1 10*3/uL (ref 4.0–10.5)
nRBC: 0 % (ref 0.0–0.2)

## 2019-08-12 LAB — GLUCOSE, CAPILLARY
Glucose-Capillary: 224 mg/dL — ABNORMAL HIGH (ref 70–99)
Glucose-Capillary: 147 mg/dL — ABNORMAL HIGH (ref 70–99)
Glucose-Capillary: 122 mg/dL — ABNORMAL HIGH (ref 70–99)
Glucose-Capillary: 156 mg/dL — ABNORMAL HIGH (ref 70–99)

## 2019-08-12 MED ORDER — SALINE SPRAY 0.65 % NA SOLN
1.0000 | NASAL | Status: DC | PRN
  Filled 2019-08-12 (×2): qty 44

## 2019-08-12 MED ORDER — FUROSEMIDE 10 MG/ML IJ SOLN
40.0000 mg | Freq: Once | INTRAMUSCULAR | Status: AC
  Administered 2019-08-12: 20:00:00 40 mg via INTRAVENOUS
  Filled 2019-08-12: qty 4

## 2019-08-12 MED ORDER — ENOXAPARIN SODIUM 150 MG/ML ~~LOC~~ SOLN
140.0000 mg | Freq: Two times a day (BID) | SUBCUTANEOUS | Status: DC
  Administered 2019-08-12 – 2019-08-13 (×4): 140 mg via SUBCUTANEOUS
  Filled 2019-08-12 (×4): qty 1

## 2019-08-12 NOTE — Consult Note (Addendum)
Advanced Heart Failure Team Consult Note   Primary Physician: Patient, No Pcp Per PCP-Cardiologist:  Verne Carrow, MD  AHFC: New (Dr. Gala Romney)   Reason for Consultation: New Systolic HF/ Ischemic Cardiomyopathy   HPI:    Michael [name] is seen today for evaluation of new systolic heart failure at the request of Dr. Donata Clay, CT surgery.   [name] is a 58 y/o morbidly obese male with no routien medical care prior to this admit. Had not seen a medical provider in over 10 years. Was previously diagnosed w/ DM but not on meds. Former [employer]. No longer working. Sedentary lifestyle. Chews tobacco but no cigarettes. Lives home alone w/ limited resources. Brother lives close by and assist w/ financial needs.   Presented to the [facility] ED 11/27 w/ CC of CP and dyspnea w/ minimal exertion and found to have elevated Hs Troponin c/w NSTEMI. Initial troponin 1,047 and peaked at 5,226. Started on IV heparin and loaded w/ ASA. Also found to have DM w/ Hgb A1c of 10.2 and HLD, w/ LDL of 210. Cardiac cath on 11/27 showed widely patent LM, diffusely diseased prox-mid LAD, up to 90% stenosis throughout the segment.  Diffuse disease mid to distal LAD without focal high-grade obstruction.  First diagonal is relatively large but contains severe proximal to mid diffuse disease, greater than 85%. Large ramus intermedius that supplies collaterals to the distal right coronary.  30% proximal narrowing. Circumflex contains diffuse 50% proximal narrowing.  First obtuse marginal is 85 to 90%. Right coronary arises from a separate ostium than the conus branch.  RCA contains proximal to mid eccentric 85% stenosis.  PDA contains proximal to mid severe diffuse disease with competition from left to right collaterals.  The continuation of the right coronary beyond the PDA contains high-grade obstruction with right to right and left to right collaterals noted. EF severely reduced by LV gram, 25-35% w/ severely  elevated  LVEDP 32-35 mmHg. He did not undergo PCI. Heart failure optimization and surgical evaluation was  recommended. IV nitro and IV lasix ordered in cath lab. Also started on high intensity statin, Losartan, Toprol XL and spirolactone. IV nitro later discontinued and transitioned to Imdur. Echo was obtained and confirmed low EF, 25-30%. RV normal. cMRi showed severe LV dilation w/ severe dysfunction, EF 19%. There is greater than 50% transmural late gadolinium enhancement suggesting nonviability in the basal to apical inferior walls, apical anterior/lateral walls, and apex.  He was evaluated by CT surgery and seen by Dr. Donata Clay for potential CABG. Not recommended to undergo CAGB at this time. He needs better control of his DM and optimization of HF prior to surgery. In addition, CT surgery doubts grafting his LAD and distal RCA would benefit the patient based on lack of viability in those regions and he would be at very high risk for poor surgical outcome.  He is feeing much better today w/ current therapy. On guidelines HF regimen + antianginals. Breathing significantly improved. Was able to ambulate w/ PT today up and down unit w/o exertional dyspnea. No CP. Remains volume overloaded w/ 2+ LEE. He is now on PO Lasix 40 mg daily. Overall net + 2L. SCr WNL at 1.00. K 3.6. BP soft in low 100s systolic but asymptomatic.   Echo 11/28 EF 25-30%, normal RV   LHC 11/27  Acute on chronic combined systolic and diastolic heart failure, EF 25 to 35%, with LVEDP 32 to 35 mmHg.  Widely patent left main  Diffusely diseased proximal to mid LAD up to 90% stenosis throughout the segment.  Diffuse disease mid to distal LAD without focal high-grade obstruction.  First diagonal is relatively large but contains severe proximal to mid diffuse disease, greater than 85%.  Large ramus intermedius that supplies collaterals to the distal right coronary.  30% proximal narrowing.  Circumflex contains diffuse 50% proximal  narrowing.  First obtuse marginal is 85 to 90%.  Right coronary arises from a separate ostium than the conus branch.  RCA contains proximal to mid eccentric 85% stenosis.  PDA contains proximal to mid severe diffuse disease with competition from left to right collaterals.  The continuation of the right coronary beyond the PDA contains high-grade obstruction with right to right and left to right collaterals noted.  cMRI 11/30  IMPRESSION: 1. Limited exam as patient unable to tolerate lying flat in the MRI scanner  2.  Severe LV dilatation with severe systolic dysfunction (EF 19%)  3.  Normal RV size with mild systolic dysfunction (EF 43%)  4. There is greater than 50% transmural late gadolinium enhancement suggesting nonviability in the basal to apical inferior walls, apical anterior/lateral walls, and apex   Review of Systems: [y] = yes,  = no    General: Weight gain ; Weight loss ; Anorexia ; Fatigue ; Fever ; Chills ; Weakness    Cardiac: Chest pain/pressure ; Resting SOB ; Exertional SOB ; Orthopnea ; Pedal Edema ; Palpitations ; Syncope ; Presyncope ; Paroxysmal nocturnal dyspnea[ ]    Pulmonary: Cough ; Wheezing[ ] ; Hemoptysis[ ] ; Sputum ; Snoring    GI: Vomiting[ ] ; Dysphagia[ ] ; Melena[ ] ; Hematochezia ; Heartburn[ ] ; Abdominal pain ; Constipation ; Diarrhea ; BRBPR    GU: Hematuria[ ] ; Dysuria ; Nocturia[ ]    Vascular: Pain in legs with walking ; Pain in feet with lying flat ; Non-healing sores ; Stroke ; TIA ; Slurred speech ;   Neuro: Headaches[ ] ; Vertigo[ ] ; Seizures[ ] ; Paresthesias[ ] ;Blurred vision ; Diplopia ; Vision changes    Ortho/Skin: Arthritis ; Joint pain ; Muscle pain ; Joint swelling ; Back Pain ; Rash    Psych: Depression[ ] ; Anxiety[ ]    Heme: Bleeding problems ; Clotting disorders ; Anemia    Endocrine: Diabetes ;  Thyroid dysfunction[ ]   Home Medications Prior to Admission medications   Medication Sig Start Date End Date Taking? Authorizing Provider  aspirin 325 MG tablet Take 325 mg by mouth once.   Yes [provider]    Past Medical History: Past Medical History:  Diagnosis Date   Dyspnea    Tobacco use     Past Surgical History: Past Surgical History:  Procedure Laterality Date   LEFT HEART CATH AND CORONARY ANGIOGRAPHY N/A 08/08/2019   Procedure: LEFT HEART CATH AND CORONARY ANGIOGRAPHY;  Surgeon: Lyn Records, MD;  Location: [facility] INVASIVE CV LAB;  Service: Cardiovascular;  Laterality: N/A;   NO PAST SURGERIES      Family History: History reviewed. No pertinent family history.  Social History: Social History   Socioeconomic History   Marital status: Single    Spouse name: Not on file   Number of children: Not on file   Years of education: Not on file  Highest education level: Not on file  Occupational History   Not on file  Social Needs   Financial resource strain: Not very hard   Food insecurity    Worry: Patient refused    Inability: Patient refused   Transportation needs    Medical: Patient refused    Non-medical: Patient refused  Tobacco Use   Smoking status: Never Smoker   Smokeless tobacco: Current User    Types: Chew   Tobacco comment: not since 30   Substance and Sexual Activity   Alcohol use: Never    Frequency: Never   Drug use: Never   Sexual activity: Not Currently  Lifestyle   Physical activity    Days per week: Patient refused    Minutes per session: Patient refused   Stress: To some extent  Relationships   Social connections    Talks on phone: Patient refused    Gets together: Patient refused    Attends religious service: Patient refused    Active member of club or organization: Patient refused    Attends meetings of clubs or organizations: Patient refused    Relationship status: Patient refused  Other  Topics Concern   Not on file  Social History Narrative   Lives alone with a cat and a dog.  Has family support from brother, aunt, uncle and cousins.     Allergies:  No Known Allergies  Objective:    Vital Signs:   Temp:  [97.4 F (36.3 C)-98.2 F (36.8 C)] 97.5 F (36.4 C) (12/01 1407) Pulse Rate:  [86-93] 93 (12/01 1407) BP: (102-121)/(75-84) 102/75 (12/01 1407) SpO2:  [98 %-100 %] 99 % (12/01 1407) Weight:  [138.8 kg] 138.8 kg (12/01 0700) Last BM Date: 08/11/19  Weight change: Filed Weights   08/10/19 0640 08/11/19 0648 08/12/19 0700  Weight: (!) 141.3 kg (!) 139.3 kg (!) 138.8 kg    Intake/Output:   Intake/Output Summary (Last 24 hours) at 08/12/2019 1442 Last data filed at 08/12/2019 1200 Gross per 24 hour  Intake 3 ml  Output 700 ml  Net -697 ml      Physical Exam    General:  Well appearing but obese WM. No resp difficulty HEENT: normal Neck: supple. JVP . Carotids 2+ bilat; no bruits. No lymphadenopathy or thyromegaly appreciated. Cor: PMI nondisplaced. Regular rate & rhythm. No rubs, gallops or murmurs. Lungs: clear Abdomen: soft, nontender, nondistended. No hepatosplenomegaly. No bruits or masses. Good bowel sounds. Extremities: no cyanosis, clubbing, rash, 2+ bilateral LE pitting edema Neuro: alert & orientedx3, cranial nerves grossly intact. moves all 4 extremities w/o difficulty. Affect pleasant   Telemetry   NSR 80s  EKG    NSR anterolateral infarct 100 bpm   Labs   Basic Metabolic Panel: Recent Labs  Lab 08/08/19 0147 08/08/19 0625 08/09/19 0410 08/10/19 0323 08/12/19 0352  NA 135  --  135 137 137  K 4.9  --  4.3 3.5 3.6  CL 103  --  100 100 101  CO2 20*  --  GLUCOSE 337*  --  305* 165* 143*  BUN 15  --  22* 22* 15  CREATININE 1.26*  --  1.26* 1.14 1.00  CALCIUM 8.9  --  8.9 8.5* 8.6*  MG  --  2.0  --   --   --     Liver Function Tests: Recent Labs  Lab 08/10/19 0323  AST 17  ALT 30  ALKPHOS 51  BILITOT  0.9  PROT 5.7*  ALBUMIN 3.3*   No results for input(s): LIPASE, AMYLASE in the last 168 hours. No results for input(s): AMMONIA in the last 168 hours.  CBC: Recent Labs  Lab 08/08/19 0147 08/09/19 0410 08/10/19 0323 08/11/19 0339 08/12/19 0352  WBC 11.3* 13.6* 10.1 9.9 8.1  HGB 15.9 14.0 13.0 14.0 13.8  HCT 47.9 41.0 38.7* 41.6 41.6  MCV 89.0 87.8 88.0 87.8 88.5  PLT 194 192 172 170 152    Cardiac Enzymes: No results for input(s): CKTOTAL, CKMB, CKMBINDEX, TROPONINI in the last 168 hours.  BNP: BNP (last 3 results) Recent Labs    08/08/19 0454 08/08/19 0625  BNP 469.2* 395.3*    ProBNP (last 3 results) No results for input(s): PROBNP in the last 8760 hours.   CBG: Recent Labs  Lab 08/11/19 1156 08/11/19 1616 08/11/19 2110 08/12/19 0829 08/12/19 1102  GLUCAP 157* 178* 129* 224* 147*    Coagulation Studies: No results for input(s): LABPROT, INR in the last 72 hours.   Imaging    No results found.   Medications:     Current Medications:  aspirin EC  81 mg Oral Daily   atorvastatin  80 mg Oral q1800   enoxaparin (LOVENOX) injection  140 mg Subcutaneous Q12H   furosemide  40 mg Oral Daily   insulin aspart  0-15 Units Subcutaneous TID WC   insulin aspart  0-5 Units Subcutaneous QHS   insulin glargine  20 Units Subcutaneous QHS   isosorbide mononitrate  15 mg Oral Daily   LORazepam  1 mg Intravenous Once   losartan  12.5 mg Oral Daily   metoprolol succinate  25 mg Oral Daily   sodium chloride flush  3 mL Intravenous Q12H   spironolactone  12.5 mg Oral Daily     Infusions:  sodium chloride         Patient Profile   Michael [name] is a 58 y/o morbidly obese male admitted for NSTEMI and found to have 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   Assessment/Plan    1. CAD: admitted for NSTEMI. Troponin peaking > 5K. LHC w/ multivessel CAD noted above. EF severely reduced by echo and cMRI  (19%). cMRI w/ greater than 50% nonviable myocardium in the inferior wall, apical anterior wall, and the  apical-lateral wall.  The only area of viability was in the mid anterior- lateral wall corresponding to the patent ramus branch of the left coronary. Seen by Dr. Prescott Gum and not felt to be great candidate for CABG at this time. Would be high risk for poor surgical outcome given poorly controlled DM. Needs better control of DM and HF optimization prior to being reconsidered for CABG.  - Currently CP free on Medical therapy.  - Continue ASA, high potency statin,  blocker and Imdur   2. Acute Systolic HF/ Ischemic CM: EF~20%. Treat w/ GDMT. - Has been started on Losartan 12.5 mg daily. BP soft in the low 100s, doubt room for Entresto - Continue Toprol XL - Spironolactone 12.5 mg. Will titrate to 25 mg daily - Imdur 15 mg daily 25 mg daily  - Add digoxin 0.125 mg daily - Would benefit from addition of a SGLTi ([place] vs [place]) - remains volume overloaded w/ 2+ edema. Currently on PO Lasix 40 daily. Will give dose of IV tonight.  - Limited resources. Will need medication assistance through HF fund. Given transportation issues may need SW assistance getting to f/u office visits  - Unfortunately  will not qualify for paramedicine as he lives in [place]  3. DM: hgb A1c 10.2. Goal <7.0  - currently on insulin - needs metformin  - start SGTLi  - needs to establish care w/ PCP post hospital   4. HLD: LDL 210 mg/dL. Goal LDL < 70 - high intensity statin started, atorvastatin 80 mg nightly - needs repeat FLP and HFTs in 6-8 weeks  - if not at target goal, consider addition of Zetia or PCSK9i   5. Obesity: Body mass index is 40.37 kg/m.  - wt loss/ diet modification needed - would benefit from cardiac rehab  6. HTN: BP goal in setting of DM and CAD < 130/80.  - controlled on current regimen for HF/CAD   Medication concerns reviewed with patient and pharmacy team. Barriers  identified: limited income, unreliable transportation   Length of Stay: [address] Sharol Harness, PA-C  08/12/2019, 2:42 PM  Advanced Heart Failure Team Pager [phone] (M-F; 7a - 4p)  Please contact [facility] for night-coverage after hours (4p -7a ) and weekends on [email or web address]  Patient seen and examined with the above-signed Advanced Practice Provider and/or Housestaff. I personally reviewed laboratory data, imaging studies and relevant notes. I independently examined the patient and formulated the important aspects of the plan. I have edited the note to reflect any of my changes or salient points. I have personally discussed the plan with the patient and/or family.   58 y/o male with obesity, uncontrolled DM2, HTN, HL whom we are asked to see by Dr. Donata Clay due to new onset HF and severe CAD.   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   Presented with NSTEMI. Cath performed and shows severe 3v CAD including occluded RCA, diffusely diseased LAD and high-grade bifurcation lesion in mid to distal LCX. cMRI with EF 17% and dense scar in inferior wall with moderate viability in anterior wall. HgbA1c 10.2%  Has been seen by Dr. Donata Clay and turned down for CABG at this time given suboptimal vessel targets and comorbidities.   Currently feels better and able to walks halls without CP or dyspnea after diuresis.   General:  Obese male sitting in chair  No resp difficulty HEENT: normal Neck: supple. JVP to jaw. Carotids 2+ bilat; no bruits. No lymphadenopathy or thryomegaly appreciated. Cor: PMI nondisplaced. Regular rate & rhythm. 2/6 MR Lungs: clear Abdomen: obese soft, nontender, nondistended. No hepatosplenomegaly. No bruits or masses. Good bowel sounds. Extremities: no cyanosis, clubbing, rash,2+  edema Neuro: alert & orientedx3, cranial nerves grossly intact. moves  all 4 extremities w/o difficulty. Affect pleasant  Cath films, MRI and echo reviewed. Given transmural infarct in inferior wall probably would not benefit from revascularization of RCA but he does have at least partial viability in anteroir and lateral walls. Ideally would benefit from CABG but is high risk due to diffuse disease in LAD, debility and poorly-controlled DM2.  He is markedly improved with initial medical therapy and I think the best plan would be to pursue several months of aggressive medical therapy to optimize his HF, get his DM2 under control and work on his functional status prio to reconsideration of high-risk CABG. Situation limited by his lack of resources and insurance. He is now applying for disability but this will likely take a while.  We will continue IV diuresis and work with our [employer]  and SWs to help get him an  The best [possible GDMT via the HF fund and assistance programs. Ideally would also attend CR but doubt he can find a way to pay for it.  He tells me that he wants to leave tomorrow but is willing to compromise and stay until Thursday. We will do our best to work expediently. Start by increasing spiro and adding digoxin. Continue low-dose ARB and b-blocker with goal to eventually get him on Entresto as BP tolerates. Will  ideally get him in ,etformin and SGLT2i.   Arvilla Meresaniel Zollie Clemence, MD  11:09 PM

## 2019-08-12 NOTE — Progress Notes (Signed)
Per MD note from this evening, patient was to receive a 1x dose of IV lasix in addition to his PO lasix. No IV lasix ordered - spoke with cards PA on call who ordered a 1x dose. Oncoming RN Shanell made aware.

## 2019-08-12 NOTE — Progress Notes (Signed)
[place] for heparin Indication: chest pain/ACS and apical thrombus  No Known Allergies  Patient Measurements: Height: 6\' 1"  (185.4 cm) Weight: (!) 307 lb (139.3 kg) IBW/kg (Calculated) : 79.9 Heparin Dosing Weight: 108.7 kg   Vital Signs: Temp: 97.4 F (36.3 C) (12/01 0700) Temp Source: Oral (12/01 0700) BP: 107/84 (12/01 0700) Pulse Rate: 86 (12/01 0700)  Labs: Recent Labs    08/10/19 0323 08/11/19 0339 08/12/19 0352  HGB 13.0 14.0 13.8  HCT 38.7* 41.6 41.6  PLT 172 170 152  CREATININE 1.14  --  1.00    Estimated Creatinine Clearance: 121 mL/min (by C-G formula based on SCr of 1 mg/dL).   Medical History: Past Medical History:  Diagnosis Date  . Dyspnea   . Tobacco use    Assessment: 58 yo man to start heparin for CP. He was not on anticoagulation PTA. Cath 11/27 showing multivessel dx and acute on chronic combined systolic/diastolic heart failure.  Patient was on heparin, however, patient is very frustrated with frequent lab draws. Pharmacy has been consulted to dose enoxaparin.  Wt is 139.3 kg. Hgb 13.8, plt 152. No s/sx of bleeding.   Goal of Therapy:  Monitor platelets by anticoagulation protocol: Yes   Plan:  Adjust enoxaparin SQ 140mg  (1mg /kg)Q 12 hours. Daily CBC while on enoxaparin Monitor for bleeding complications F/u cardiac plans and when transition to oral [facility]  Antonietta Jewel, PharmD, [place] Pharmacist  Phone: [phone]  Please check AMION for all [place] phone numbers After 10:00 PM, call [place] [phone] 08/12/2019,7:25 AM

## 2019-08-12 NOTE — Progress Notes (Signed)
CARDIAC REHAB PHASE I   PRE:  Rate/Rhythm: 95 SR    BP: sitting 102/75    SaO2: 99 RA  MODE:  Ambulation: 470 ft   POST:  Rate/Rhythm: 108 ST    BP: sitting 110/77     SaO2: 99 RA  Pt able to walk hallway without CP or SOB. Sts he feels well. Began education process with MI and HF. He is somewhat receptive but also tangential. I encouraged him to review materials and we can continue to discuss.  [place], ACSM 08/12/2019 2:49 PM

## 2019-08-12 NOTE — Progress Notes (Signed)
TCTS consulted for CABG evaluation. °

## 2019-08-12 NOTE — Consult Note (Signed)
[facility] Consult Note  Service Pager: [phone]  Patient name: Michael [name] Medical record number: [id number] Date of birth: 30-Dec-1960 Age: 58 y.o. Gender: male  Primary Care Provider: Patient, No Pcp Per Code Status: Full Code  Preferred Emergency Contact: Michael [phone]  Chief Complaint: Chest pain   Assessment and Plan: Michael [name] is a 58 y.o. male presenting with chest pain and dyspnea.  Patient has no known past medical history.  He has not seen a physician in over 10 years.   Type 2 diabetes mellitus HBA1c:10.4 on admission.  CBGs 129,178,157 overnight  -Lantus increased to 20 units QHS on 11/30 -Moderate sliding scale -CBGs with before meals and at bedtime -Diabetes education -Consider SGLT2 inhibitor on discharge  Chest pain/NSTEMI Primary team managing -Follow primary team's recs -PCI versus CABG decision when stabilized, cardiac MRI and possible CT surgery consult -Asprin, Atorvastatin 40 mg daily -Carvediolol d/c, started metoprolol 25mg   On 11/30   Acute HFrEF/Apical Thrombus Left heart cath after admission showed LVEDP 32-35 mmHg.  LVEF of 25 to 35%.  Primary team started Lasix 40 mg IV daily.  Per cardiology, patient should be on Entresto if cost permits, when he is diuresed.  On treatment dose Lovenox, with eventual plan to transition to [place]. -Daily weights -Check O2 sats with ambulation - Start low dose Aldactone  -Continue Losartan    Hyperlipidemia -Continue Atorvastatin 80 mg per primary  Social Issues  Patient has issues with care management.  Patient is unemployed and has no insurance.  He has not worked in over 10 years.  He reports that he has attempted to get assistance and been told he does not qualify. -Casework consult   FEN/GI: Per primary team Prophylaxis: Per primary team  Disposition: Per primary team  Subjective: Pt feels well. Has long standing dry cough producing white sputum. Denies SOB,  chest pain or fevers. Tolerating PO, passing urine and BO.  Objective: Temp:  [97.4 F (36.3 C)-98.2 F (36.8 C)] 97.4 F (36.3 C) (12/01 0700) Pulse Rate:  [86-92] 86 (12/01 0700) BP: (107-121)/(78-84) 107/84 (12/01 0700) SpO2:  [98 %-100 %] 98 % (12/01 0700) Weight:  [138.8 kg] 138.8 kg (12/01 0700)  General: Alert and cooperative and appears to be in no acute distress HEENT: Neck non-tender without lymphadenopathy, masses or thyromegaly Cardio: Normal S1 and S2, no S3 or S4. Rhythm is regular. No murmurs or rubs.   Pulm: Clear to auscultation bilaterally, no crackles, wheezing, or diminished breath sounds. Normal respiratory effort Abdomen: Bowel sounds normal. Abdomen soft and non-tender.  Extremities: Bilateral pitting edema up to knees. Warm/ well perfused.  Strong radial pulse Neuro: Cranial nerves grossly intact   Labs and Imaging: CBC BMET  Recent Labs  Lab 08/12/19 0352  WBC 8.1  HGB 13.8  HCT 41.6  PLT 152   Recent Labs  Lab 08/12/19 0352  NA 137  K 3.6  CL 101  CO2 22  BUN 15  CREATININE 1.00  GLUCOSE 143*  CALCIUM 8.6*    Troponins-1047 (11/27) > 3525 (11/27)> 4988> 5226 BNP-469.2> 395.3  EKG on admission showed sinus tachycardia with anterior Q waves V1-V4, no acute ischemic changes seen.  Repeat EKG 11/28 showed normal sinus rhythm   Michael Haw, MD 08/12/2019, 1:52 PM PGY-1, [place] Intern pager: [phone], text pages welcome

## 2019-08-12 NOTE — Consult Note (Signed)
[address]       [place] [address]             [phone]        [name] Medical Record #[id number] Date of Birth: [birth year]  Referring: Dr. Swaziland Primary Care: Patient, No Pcp Per Primary Cardiologist:Christopher Clifton James, MD  Chief Complaint:    Chief Complaint  Patient presents with  . Chest Pain  . Shortness of Breath   Patient examined, images of coronary angiogram and echocardiogram and cardiac MRI personally reviewed and counseled with patient  history of Present Illness:     58 year old obese uncontrolled diabetic non-smoker presented on November 27 with shortness of breath edema and chest pain.  Cardiac enzymes were mildly elevated consistent with non-STEMI.  Chest x-ray showed pulmonary edema.  Urgent cardiac catheterization showed EF 20%, LVEDP 35, severe multivessel coronary disease in a diabetic pattern with high-grade stenosis of the LAD, high-grade stenosis of the dominant RCA, and large ramus branch without disease providing some collateralization.  The patient was treated with diuresis and clinically improved.  Echocardiogram showed severe LV dilatation and dysfunction with LV diameter 7 cm, no significant MR AI or TR, mild RV dysfunction.  He is in sinus rhythm.  Patient was started on medical therapy for his ischemic cardiomyopathy.  Yesterday he underwent cardiac MRI viability study.  He had significant difficulty with the scan because of orthopnea.  Ejection fraction remains at 20%.  There was greater than 50% nonviable myocardium in the inferior wall, apical anterior wall, and the  apical-lateral wall.  The only area of viability was in the mid anterior- lateral wall corresponding to the patent ramus branch of the left coronary.  Patient currently feels much better on medical therapy.  He is off oxygen but uses CPAP at night. On admission a hemoglobin A1c was 10.2.  With severe LV dysfunction, LV dilatation, significant  myocardial scarring in the area of the target vessels to be grafted [LAD, poor quality distal posterior descending, OM1] and with uncontrolled severe diabetes the patient is a poor candidate for surgical revascularization. I would currently recommend medical therapy which will be an issue because the patient has no job and no income.  I will follow the patient up in the office in about 4 weeks  Current Activity/ Functional Status: Lives alone sedentary activity.  Brother lives close by. He has difficulty with meeting the cost of food and shelter. Zubrod Score:pical At the time of surgery this patient's most appropriate activity status/level should be described as:     0    Normal activity, no symptoms     1    Restricted in physical strenuous activity but ambulatory, able to do out light work     2    Ambulatory and capable of self care, unable to do work activities, up and about                 more than 50%  Of the time                                3    Only limited self care, in bed greater than 50% of waking hours     4    Completely disabled, no self care, confined to bed or chair     5    Moribund  Past Medical History:  Diagnosis  Date  . Dyspnea   . Tobacco use     Past Surgical History:  Procedure Laterality Date  . LEFT HEART CATH AND CORONARY ANGIOGRAPHY N/A 08/08/2019   Procedure: LEFT HEART CATH AND CORONARY ANGIOGRAPHY;  Surgeon: Lyn Records, MD;  Location: [facility];  Service: Cardiovascular;  Laterality: N/A;  . NO PAST SURGERIES      Social History   Tobacco Use  Smoking Status Never Smoker  Smokeless Tobacco Current User  . Types: Chew  Tobacco Comment   not since 2     Social History   Substance and Sexual Activity  Alcohol Use Never  . Frequency: Never     No Known Allergies  Current Facility-Administered Medications  Medication Dose Route Frequency Provider Last Rate Last Dose  . 0.9 %  sodium chloride infusion  250 mL  Intravenous PRN Lyn Records, MD      . acetaminophen (TYLENOL) tablet 650 mg  650 mg Oral Q4H PRN Lyn Records, MD      . aspirin EC tablet 81 mg  81 mg Oral Daily Bodziock, Melrose Nakayama, MD   81 mg at 08/12/19 1100  . atorvastatin (LIPITOR) tablet 80 mg  80 mg Oral q1800 Chilton Si, MD   80 mg at 08/11/19 1731  . enoxaparin (LOVENOX) injection 140 mg  140 mg Subcutaneous Q12H Bodziock, Melrose Nakayama, MD   140 mg at 08/12/19 1102  . furosemide (LASIX) tablet 40 mg  40 mg Oral Daily Chilton Si, MD   40 mg at 08/12/19 1059  . insulin aspart (novoLOG) injection 0-15 Units  0-15 Units Subcutaneous TID WC Marcelino Duster, PA   5 Units at 08/12/19 0848  . insulin aspart (novoLOG) injection 0-5 Units  0-5 Units Subcutaneous QHS Marcelino Duster, [place]   2 Units at 08/09/19 2223  . insulin glargine (LANTUS) injection 20 Units  20 Units Subcutaneous QHS Myrene Buddy, MD   20 Units at 08/11/19 2202  . isosorbide mononitrate (IMDUR) 24 hr tablet 15 mg  15 mg Oral Daily Barrett, Rhonda G, PA-C   15 mg at 08/12/19 1059  . LORazepam (ATIVAN) injection 1 mg  1 mg Intravenous Once Laverda Page B, NP      . losartan (COZAAR) tablet 12.5 mg  12.5 mg Oral Daily Chilton Si, MD   12.5 mg at 08/12/19 1100  . metoprolol succinate (TOPROL-XL) 24 hr tablet 25 mg  25 mg Oral Daily Chilton Si, MD   25 mg at 08/12/19 1059  . nitroGLYCERIN (NITROSTAT) SL tablet 0.4 mg  0.4 mg Sublingual Q5 Min x 3 PRN Bodziock, Melrose Nakayama, MD      . ondansetron [name]) injection 4 mg  4 mg Intravenous Q6H PRN Lyn Records, MD      . oxyCODONE (Oxy IR/ROXICODONE) immediate release tablet 5-10 mg  5-10 mg Oral Q4H PRN Lyn Records, MD      . sodium chloride (OCEAN) 0.65 % nasal spray 1 spray  1 spray Each Nare PRN Donata Clay, Theron Arista, MD      . sodium chloride flush (NS) 0.9 % injection 3 mL  3 mL Intravenous Q12H Lyn Records, MD   3 mL at 08/11/19 1208  . sodium chloride flush (NS) 0.9 % injection 3 mL  3  mL Intravenous PRN Lyn Records, MD      . spironolactone (ALDACTONE) tablet 12.5 mg  12.5 mg Oral Daily Swaziland, Peter M, MD   12.5  mg at 08/12/19 1059    Medications Prior to Admission  Medication Sig Dispense Refill Last Dose  . aspirin 325 MG tablet Take 325 mg by mouth once.   08/07/2019 at Unknown time    History reviewed. No pertinent family history.   Review of Systems:   ROS      Cardiac Review of Systems: Y or  [    ]= no  Chest Pain [   y ]  Resting SOB Blue.Reese   ] Exertional SOB  [  ]  Orthopnea [ y ]   Pedal Edema [ y  ]    Palpitations [  ] Syncope  [  ]   Presyncope [   ]  General Review of Systems: [Y] = yes [  ]=no Constitional: recent weight change [  ]; anorexia [  ]; fatigue Blue.Reese  ]; nausea [  ]; night sweats [  ]; fever [  ]; or chills [  ]                                                               Dental: Last Dentist visit: 1 year  Eye : blurred vision [  ]; diplopia [   ]; vision changes [  ];  Amaurosis fugax[  ]; Resp: cough [  ];  wheezing[  ];  hemoptysis[  ]; shortness of breath[  y]; paroxysmal nocturnal dyspnea[  ]; dyspnea on exertion[ y ]; or orthopnea[  y];  GI:  gallstones[  ], vomiting[  ];  dysphagia[  ]; melena[  ];  hematochezia [  ]; heartburn[  ];   Hx of  Colonoscopy[  ]; GU: kidney stones [  ]; hematuria[  ];   dysuria [  ];  nocturia[  ];  history of     obstruction [  ]; urinary frequency [  ]             Skin: rash, swelling[ y ];, hair loss[  ];  peripheral edema[  ];  or itching[  ]; Musculosketetal: myalgias[  ];  joint swelling[  ];  joint erythema[  ];  joint pain[  ];  back pain[  ];  Heme/Lymph: bruising[  ];  bleeding[  ];  anemia[  ];  Neuro: TIA[  ];  headaches[  ];  stroke[  ];  vertigo[  ];  seizures[  ];   paresthesias[  ];  difficulty walking[  ];  Psych:depression[  ]; anxiety[  ];  Endocrine: diabetes[ y ];  thyroid dysfunction[  ];      Neuropathy of both feet present      Physical Exam: BP 107/84 (BP Location: Right  Arm)   Pulse 86   Temp (!) 97.4 F (36.3 C) (Oral)   Resp 18   Ht 6\' 1"  (1.854 m)   Wt (!) 138.8 kg   SpO2 98%   BMI 40.37 kg/m        Physical Exam  General: Morbidly obese 58 year old Caucasian male no acute distress HEENT: Normocephalic pupils equal , dentition adequate Neck: Supple without JVD, adenopathy, or bruit Chest: Clear to auscultation, symmetrical breath sounds, no rhonchi, no tenderness             or deformity Cardiovascular: Regular rate and rhythm,  no murmur, no gallop, peripheral pulses             palpable in all extremities Abdomen:  Soft, nontender, no palpable mass or organomegaly Extremities: Edematous cool extremities without clubbing cyanosis  tenderness,              no venous stasis changes of the legs Rectal/GU: Deferred Neuro: Grossly non--focal and symmetrical throughout Skin: Clean and dry without rash or ulceration, scarred ulcers in the legs.   Diagnostic Studies & Laboratory data:     Recent Radiology Findings:   Mr Cardiac Morphology W Wo Contrast  Result Date: 08/11/2019 CLINICAL DATA:  Severe multivessel coronary disease, evaluate for viability EXAM: CARDIAC MRI TECHNIQUE: The patient was scanned on a 1.5 Tesla Siemens magnet. A dedicated cardiac coil was used. Functional imaging was done using Fiesta sequences. 2,3, and 4 chamber views were done to assess for RWMA's. Modified Simpson's rule using a short axis stack was used to calculate an ejection fraction on a dedicated work Research officer, trade unionstation using Circle software. The patient received 10cc of Gadavist. After 10 minutes inversion recovery sequences were used to assess for infiltration and scar tissue. CONTRAST:  10 cc  of Gadavist FINDINGS: Left ventricle: - Severe dilatation - Severe systolic dysfunction. Global hypokinesis with inferior akinesis - LGE: >50% transmural: Basal inferior, mid inferior, apical anterior, apical inferior, apical lateral, apex < 50% transmural: Mid anterior, mid  anterolateral, mid inferolateral LV EF: 19% (Normal 56-78%) Absolute volumes: LV EDV: 401mL (Normal 77-195 mL) LV ESV: 326mL (Normal 19-72 mL) LV SV: 75mL (Normal 51-133 mL) CO: 6.5L/min (Normal 2.8-8.8 L/min) Indexed volumes: LV EDV: 14150mL/sq-m (Normal 47-92 mL/sq-m) LV ESV: 12122mL/sq-m (Normal 13-30 mL/sq-m) LV SV: 6228mL/sq-m (Normal 32-62 mL/sq-m) CI: 2.4L/min/sq-m (Normal 1.7-4.2 L/min/sq-m) Right ventricle: Normal size with mild systolic dysfunction RV EF:  43% (Normal 47-74%) Absolute volumes: RV EDV: 185mL (Normal 88-227 mL) RV ESV: 106mL (Normal 23-103 mL) RV SV: 79mL (Normal 52-138 mL) CO: 6.8L/min (Normal 2.8-8.8 L/min) Indexed volumes: RV EDV: 6069mL/sq-m (Normal 55-105 mL/sq-m) RV ESV: 5939mL/sq-m (Normal 15-43 mL/sq-m) RV SV: 7129mL/sq-m (Normal 32-64 mL/sq-m) CI: 2.5L/min/sq-m (Normal 1.7-4.2 L/min/sq-m) IMPRESSION: 1. Limited exam as patient unable to tolerate lying flat in the MRI scanner 2.  Severe LV dilatation with severe systolic dysfunction (EF 19%) 3.  Normal RV size with mild systolic dysfunction (EF 43%) 4. There is greater than 50% transmural late gadolinium enhancement suggesting nonviability in the basal to apical inferior walls, apical anterior/lateral walls, and apex Electronically Signed   By: Epifanio Lescheshristopher  Schumann MD   On: 08/11/2019 23:21     I have independently reviewed the above radiologic studies and discussed with the patient   Recent Lab Findings: Lab Results  Component Value Date   WBC 8.1 08/12/2019   HGB 13.8 08/12/2019   HCT 41.6 08/12/2019   PLT 152 08/12/2019   GLUCOSE 143 (H) 08/12/2019   CHOL 269 (H) 08/08/2019   TRIG 89 08/08/2019   HDL 41 08/08/2019   LDLCALC 210 (H) 08/08/2019   ALT 30 08/10/2019   AST 17 08/10/2019   NA 137 08/12/2019   K 3.6 08/12/2019   CL 101 08/12/2019   CREATININE 1.00 08/12/2019   BUN 15 08/12/2019   CO2 22 08/12/2019   TSH 2.400 08/08/2019   INR 1.1 08/08/2019   HGBA1C 10.2 (H) 08/08/2019      Assessment / Plan  Non-STEMI with severe multivessel CAD Ischemic dilated cardiomyopathy Class IV heart failure Uncontrolled diabetes mellitus Obesity  Would not  recommend CABG for this patient at this time.  Recommend medical therapy of his ischemic cardiomyopathy.  Recommend medical control of his diabetes.  I doubt that grafting his LAD and distal RCA would benefit the patient based on the lack of viability in those regions and he would be at very high risk for poor surgical outcome. Recommend establishing follow-up in the advanced heart failure clinic.   @ME1 @ 08/12/2019 11:55 AM

## 2019-08-12 NOTE — Progress Notes (Signed)
Progress Note  Patient Name: Michael [name] Date of Encounter: 08/12/2019  Primary Cardiologist: Lauree Chandler, MD   Subjective   Feeling well this morning. No chest pain and has been walking around without symptoms.   Inpatient Medications    Scheduled Meds: . aspirin EC  81 mg Oral Daily  . atorvastatin  80 mg Oral q1800  . enoxaparin (LOVENOX) injection  140 mg Subcutaneous Q12H  . furosemide  40 mg Oral Daily  . insulin aspart  0-15 Units Subcutaneous TID WC  . insulin aspart  0-5 Units Subcutaneous QHS  . insulin glargine  20 Units Subcutaneous QHS  . isosorbide mononitrate  15 mg Oral Daily  . LORazepam  1 mg Intravenous Once  . losartan  12.5 mg Oral Daily  . metoprolol succinate  25 mg Oral Daily  . sodium chloride flush  3 mL Intravenous Q12H  . spironolactone  12.5 mg Oral Daily   Continuous Infusions: . sodium chloride     PRN Meds: sodium chloride, acetaminophen, nitroGLYCERIN, ondansetron (ZOFRAN) IV, oxyCODONE, sodium chloride flush   Vital Signs    Vitals:   08/11/19 1346 08/11/19 2113 08/11/19 2113 08/12/19 0700  BP: 115/71 121/78 121/78 107/84  Pulse: 98 92 91 86  Resp:      Temp: (!) 97.5 F (36.4 C) 98.2 F (36.8 C) 98.2 F (36.8 C) (!) 97.4 F (36.3 C)  TempSrc: Oral Oral Oral Oral  SpO2: 97% 100% 100% 98%  Weight:    (!) 138.8 kg  Height:        Intake/Output Summary (Last 24 hours) at 08/12/2019 0847 Last data filed at 08/11/2019 2200 Gross per 24 hour  Intake 200 ml  Output 700 ml  Net -500 ml   Last 3 Weights 08/12/2019 08/11/2019 08/10/2019  Weight (lbs) 306 lb 307 lb 311 lb 9.6 oz  Weight (kg) 138.801 kg 139.254 kg 141.341 kg      Telemetry    ST - Personally Reviewed  ECG    No new tracing this morning.  Physical Exam  WM sitting up in chair GEN: No acute distress.   Neck: No JVD Cardiac: Tachy, no murmurs, rubs, or gallops.  Respiratory: Clear to auscultation bilaterally. GI: Soft, nontender, non-distended   MS: No edema; No deformity. Right radial cath site stable. Neuro:  Nonfocal  Psych: Normal affect   Labs    High Sensitivity Troponin:   Recent Labs  Lab 08/08/19 0147 08/08/19 0347 08/08/19 0625 08/08/19 0918  TROPONINIHS 1,047* 3,525* 4,988* 5,226*      Chemistry Recent Labs  Lab 08/09/19 0410 08/10/19 0323 08/12/19 0352  NA 135 137 137  K 4.3 3.5 3.6  CL 100 100 101  CO2 22 28 22   GLUCOSE 305* 165* 143*  BUN 22* 22* 15  CREATININE 1.26* 1.14 1.00  CALCIUM 8.9 8.5* 8.6*  PROT  --  5.7*  --   ALBUMIN  --  3.3*  --   AST  --  17  --   ALT  --  30  --   ALKPHOS  --  51  --   BILITOT  --  0.9  --   GFRNONAA >60 >60 >60  GFRAA >60 >60 >60  ANIONGAP 13 9 14      Hematology Recent Labs  Lab 08/10/19 0323 08/11/19 0339 08/12/19 0352  WBC 10.1 9.9 8.1  RBC 4.40 4.74 4.70  HGB 13.0 14.0 13.8  HCT 38.7* 41.6 41.6  MCV 88.0 87.8 88.5  MCH  29.5 29.5 29.4  MCHC 33.6 33.7 33.2  RDW 12.9 12.7 12.7  PLT 172 170 152    BNP Recent Labs  Lab 08/08/19 0454 08/08/19 0625  BNP 469.2* 395.3*     DDimer No results for input(s): DDIMER in the last 168 hours.   Radiology    Mr Cardiac Morphology W Wo Contrast  Result Date: 08/11/2019 CLINICAL DATA:  Severe multivessel coronary disease, evaluate for viability EXAM: CARDIAC MRI TECHNIQUE: The patient was scanned on a 1.5 Tesla Siemens magnet. A dedicated cardiac coil was used. Functional imaging was done using Fiesta sequences. 2,3, and 4 chamber views were done to assess for RWMA's. Modified Simpson's rule using a short axis stack was used to calculate an ejection fraction on a dedicated work Research officer, trade union. The patient received 10cc of Gadavist. After 10 minutes inversion recovery sequences were used to assess for infiltration and scar tissue. CONTRAST:  10 cc  of Gadavist FINDINGS: Left ventricle: - Severe dilatation - Severe systolic dysfunction. Global hypokinesis with inferior akinesis - LGE: >50%  transmural: Basal inferior, mid inferior, apical anterior, apical inferior, apical lateral, apex < 50% transmural: Mid anterior, mid anterolateral, mid inferolateral LV EF: 19% (Normal 56-78%) Absolute volumes: LV EDV: (Normal 77-195 mL) LV ESV: (Normal 19-72 mL) LV SV: 22mL (Normal 51-133 mL) CO: 6.5L/min (Normal 2.8-8.8 L/min) Indexed volumes: LV EDV: 129mL/sq-m (Normal 47-92 mL/sq-m) LV ESV: 154mL/sq-m (Normal 13-30 mL/sq-m) LV SV: 29mL/sq-m (Normal 32-62 mL/sq-m) CI: 2.4L/min/sq-m (Normal 1.7-4.2 L/min/sq-m) Right ventricle: Normal size with mild systolic dysfunction RV EF:  43% (Normal 47-74%) Absolute volumes: RV EDV: (Normal 88-227 mL) RV ESV: (Normal 23-103 mL) RV SV: 82mL (Normal 52-138 mL) CO: 6.8L/min (Normal 2.8-8.8 L/min) Indexed volumes: RV EDV: 24mL/sq-m (Normal 55-105 mL/sq-m) RV ESV: 74mL/sq-m (Normal 15-43 mL/sq-m) RV SV: 48mL/sq-m (Normal 32-64 mL/sq-m) CI: 2.5L/min/sq-m (Normal 1.7-4.2 L/min/sq-m) IMPRESSION: 1. Limited exam as patient unable to tolerate lying flat in the MRI scanner 2.  Severe LV dilatation with severe systolic dysfunction (EF 19%) 3.  Normal RV size with mild systolic dysfunction (EF 43%) 4. There is greater than 50% transmural late gadolinium enhancement suggesting nonviability in the basal to apical inferior walls, apical anterior/lateral walls, and apex Electronically Signed   By: Epifanio Lesches MD   On: 08/11/2019 23:21    Cardiac Studies   Cath: 08/08/19   Acute on chronic combined systolic and diastolic heart failure, EF 25 to 35%, with LVEDP 32 to 35 mmHg.  Widely patent left main  Diffusely diseased proximal to mid LAD up to 90% stenosis throughout the segment.  Diffuse disease mid to distal LAD without focal high-grade obstruction.  First diagonal is relatively large but contains severe proximal to mid diffuse disease, greater than 85%.  Large ramus intermedius that supplies collaterals to the distal right coronary.  30%  proximal narrowing.  Circumflex contains diffuse 50% proximal narrowing.  First obtuse marginal is 85 to 90%.  Right coronary arises from a separate ostium than the conus branch.  RCA contains proximal to mid eccentric 85% stenosis.  PDA contains proximal to mid severe diffuse disease with competition from left to right collaterals.  The continuation of the right coronary beyond the PDA contains high-grade obstruction with right to right and left to right collaterals noted.  RECOMMENDATIONS:   IV nitroglycerin and IV Lasix have been ordered to help treat heart failure.  IV nitroglycerin will also serve to improve myocardial perfusion.  Heart failure optimization will allow  consideration of revascularization.  The patient may need a myocardial viability study.  Spoke with Dr. Clifton JamesMcAlhany.  The patient is clinically tenuous and if he does not respond to the measures outlined above, advanced heart failure service should be consulted.  Diagnostic Dominance: Right  TTE: 08/09/19  IMPRESSIONS    1. Left ventricular ejection fraction, by visual estimation, is 25 to 30%. The left ventricle has severely decreased function. There is no left ventricular hypertrophy.  2. Definity contrast agent was given IV to delineate the left ventricular endocardial borders.  3. Left ventricular diastolic parameters are indeterminate.  4. Global right ventricle has normal systolic function.The right ventricular size is normal.  5. Left atrial size was normal.  6. Right atrial size was normal.  7. The mitral valve is normal in structure. Trace mitral valve regurgitation. No evidence of mitral stenosis.  8. The tricuspid valve is normal in structure. Tricuspid valve regurgitation is not demonstrated.  9. The aortic valve was not well visualized. Aortic valve regurgitation is not visualized. No evidence of aortic valve sclerosis or stenosis. 10. The pulmonic valve was not well visualized. Pulmonic valve  regurgitation is not visualized. 11. The inferior vena cava is normal in size with greater than 50% respiratory variability, suggesting right atrial pressure of 3 mmHg. 12. Definity used; severe global reduction in LV systolic function; probable small apical thrombus.   Patient Profile     58 y.o. male with no reported PMH though had not seen PCP in >10 years who presented with chest pain and found to have elevated troponin. Underwent cath noted above.   Assessment & Plan    1. NSTEMI: peaked at 5226. Underwent cardiac cath noted above with severe 3v disease. On ASA, statin, BB. Underwent viability study with MRI yesterday with viability of mid anterior, anterolateral and inferolateral walls. Will ask for TCTS consult today to discuss options, if not felt to be a CABG candidate will continue with medical therapy.   2. Acute combine HF: EF noted at 25-30%. LVEDP 35 on cath. Given several dose of IV lasix. Switched from coreg to metoprolol for better HR control. On losartan as well. Lasix 40mg  daily. Renal function is now much improved. Cleda DaubSpiro added yesterday. Reports some lightheadedness, therefore will hold on further titration.   3. Apical Thrombus: now treated with lovenox as he did not like freq labs draws with IV heparin. Ideally would treat with coumadin given his weight. Seems more open to compliance today. May need to consider DOAC if compliance is an issue.   4. DM: Hgb A1c 10.2. Family med consulted and following. On SSI, and lantus. Consider SGLT2 closer to discharge.   5. HL: LDL 210. Started on high dose statin.  6. Social issues: reports significant financial issues. No insurance, and feels very overwhelmed with situation. CM consulted and SW following for needs.   For questions or updates, please contact [facility] Please consult [email or web address] for contact info under        Signed, Laverda PageLindsay Roberts, NP  08/12/2019, 8:47 AM

## 2019-08-13 ENCOUNTER — Telehealth (HOSPITAL_COMMUNITY): Payer: Self-pay | Admitting: Pharmacist

## 2019-08-13 ENCOUNTER — Inpatient Hospital Stay (HOSPITAL_COMMUNITY): Payer: Self-pay

## 2019-08-13 LAB — CBC
HCT: 41.3 % (ref 39.0–52.0)
Hemoglobin: 14 g/dL (ref 13.0–17.0)
MCH: 29.5 pg (ref 26.0–34.0)
MCHC: 33.9 g/dL (ref 30.0–36.0)
MCV: 87.1 fL (ref 80.0–100.0)
Platelets: 163 10*3/uL (ref 150–400)
RBC: 4.74 MIL/uL (ref 4.22–5.81)
RDW: 12.6 % (ref 11.5–15.5)
WBC: 9.4 10*3/uL (ref 4.0–10.5)
nRBC: 0 % (ref 0.0–0.2)

## 2019-08-13 LAB — GLUCOSE, CAPILLARY
Glucose-Capillary: 207 mg/dL — ABNORMAL HIGH (ref 70–99)
Glucose-Capillary: 128 mg/dL — ABNORMAL HIGH (ref 70–99)
Glucose-Capillary: 160 mg/dL — ABNORMAL HIGH (ref 70–99)
Glucose-Capillary: 165 mg/dL — ABNORMAL HIGH (ref 70–99)

## 2019-08-13 LAB — BASIC METABOLIC PANEL
Anion gap: 11 (ref 5–15)
BUN: 13 mg/dL (ref 6–20)
CO2: 27 mmol/L (ref 22–32)
Calcium: 8.7 mg/dL — ABNORMAL LOW (ref 8.9–10.3)
Chloride: 98 mmol/L (ref 98–111)
Creatinine, Ser: 1.17 mg/dL (ref 0.61–1.24)
GFR calc Af Amer: >60 mL/min (ref >60)
GFR calc non Af Amer: >60 mL/min (ref >60)
Glucose, Bld: 144 mg/dL — ABNORMAL HIGH (ref 70–99)
Potassium: 3.7 mmol/L (ref 3.5–5.1)
Sodium: 136 mmol/L (ref 135–145)

## 2019-08-13 MED ORDER — DIGOXIN 125 MCG PO TABS
0.1250 mg | ORAL_TABLET | Freq: Every day | ORAL | Status: DC
  Administered 2019-08-13 – 2019-08-14 (×2): 0.125 mg via ORAL
  Filled 2019-08-13 (×2): qty 1

## 2019-08-13 MED ORDER — POTASSIUM CHLORIDE CRYS ER 20 MEQ PO TBCR
40.0000 meq | EXTENDED_RELEASE_TABLET | Freq: Once | ORAL | Status: AC
  Administered 2019-08-13: 13:00:00 40 meq via ORAL
  Filled 2019-08-13: qty 2

## 2019-08-13 MED ORDER — LOSARTAN POTASSIUM 25 MG PO TABS: 12.5000 mg | ORAL_TABLET | Freq: Every day | ORAL | Status: DC

## 2019-08-13 MED ORDER — SPIRONOLACTONE 25 MG PO TABS
25.0000 mg | ORAL_TABLET | Freq: Every day | ORAL | Status: DC
  Administered 2019-08-13 – 2019-08-14 (×2): 25 mg via ORAL
  Filled 2019-08-13 (×2): qty 1

## 2019-08-13 NOTE — Progress Notes (Signed)
[facility] CSW referred by MD to assist patient with transportation and discuss medication/insurance options post discharge. Patient reports he is a 58yo male who has not worked in 10+ years. He states that he lives in a double wide on his own property with a second double wide on the same property where his brother resides. He reports he has no income or insurance. He reported that he applied for Disability a few years ago but was denied. His only income at this time is from his brother who pays weekly rent to him. Patient reports limited resources for travel to [facility] for appointments. CSW discussed application for Social Security although typically takes months for any resolution. In the meantime, CSW will assist with some financial assistance for transportation and pharmacy is working on patient assistance and HF fund to cover mediations needs. Patient verbalizes understanding of follow up and support provided. Patient appears grateful for support and will follow up with CSW in the HF clinic post hospitalization. Raquel Sarna, [place], [place]

## 2019-08-13 NOTE — Progress Notes (Signed)
Inpatient Diabetes Program Recommendations  AACE/ADA: New Consensus Statement on Inpatient Glycemic Control (2015)  Target Ranges:  Prepandial:   less than 140 mg/dL      Peak postprandial:   less than 180 mg/dL (1-2 hours)      Critically ill patients:  140 - 180 mg/dL   Lab Results  Component Value Date   GLUCAP 128 (H) 08/13/2019   HGBA1C 10.2 (H) 08/08/2019    Review of Glycemic Control  Results for [place], [place] (MRN [id number]) as of 08/13/2019 12:45  Ref. Range 08/12/2019 16:39 08/12/2019 23:00 08/13/2019 08:03 08/13/2019 11:35  Glucose-Capillary Latest Ref Range: 70 - 99 mg/dL 122 (H) 156 (H) 207 (H) 128 (H)    Diabetes history: DM2 Outpatient Diabetes medications: None Current orders for Inpatient glycemic control: Novolog 0-15 units TID with meals + 0-5 units QHS + Lantus 20 units QD.    Inpatient Diabetes Program Recommendations:     Note: Provided patient with a Relion meter, strips and lancets.   Patient practiced with insulin pen administration again today and did very well.  Living Well with Diabetes book is at bedside.     -Consider starting Novolin 70/30 13 units BID starting this evening with supper in anticipation of patient discharge to evaluate dosing; if change is made please DC Lantus.    Thank you, Geoffry Paradise, RN, BSN Diabetes Coordinator Inpatient Diabetes Program [phone] (team pager from 8a-5p)

## 2019-08-13 NOTE — Progress Notes (Addendum)
CARDIAC REHAB PHASE I   PRE:  Rate/Rhythm: 93 SR    BP: sitting 118/93    SaO2:   MODE:  Ambulation: 850 ft   POST:  Rate/Rhythm: 117 ST    BP: sitting 136/87     SaO2:   Pt tolerated well, increased distance. HR elevated but no c/o. Discussed HF booklet, which he had read this am. Understands the importance of daily wts, meds, sx of fluid, walking, NTG, and low sodium diet. Pt is working on change. He is unable to think about quitting chewing tobacco however. Not interested in discussing. I gave him information on CRPII however pt sts it will not be an option without disability (needs gas money). He does not have internet for the virtual option. Encouraged walking halls here. Will put in referral for CRPII G'sO. [place], ACSM 08/13/2019 10:24 AM

## 2019-08-13 NOTE — Consult Note (Signed)
[facility] Consult Note  Service Pager: [phone]  Patient name: Michael [name] Medical record number: [id number] Date of birth: [birth year] Age: 58 y.o. Gender: male  Primary Care Provider: Patient, No Pcp Per Code Status: Full Code  Preferred Emergency Contact: Michael [phone]  Chief Complaint: Chest pain   Assessment and Plan: Michael [name] is a 58 y.o. male presenting with chest pain and dyspnea.  Patient has no known past medical history.  He has not seen a physician in over 10 years.   Type 2 diabetes mellitus HBA1c:10.4 on admission.  CBGs 156, 122 over last 24 hours -Continue Lantus at 20 units  -Moderate sliding scale -CBGs with before meals and at bedtime -Diabetes education -Metformin XR 500mg , Empagliflozin 10mg , Lantus 15 units on discharge.    Chest pain/NSTEMI Primary team managing -Follow primary team's recs -Cardiology would not recommend CABG at this time: High risk for poor surgical outcome, recommends medical therapy for ischemic cardiomyopathy.  -Asprin, Atorvastatin 40 mg daily -Carvediolol d/c, started metoprolol 25mg   On 11/30   Acute HFrEF/Apical Thrombus Left heart cath after admission showed LVEDP 32-35 mmHg.  LVEF of 25 to 35%.  Primary team started Lasix 40 mg IV daily.  Per cardiology, patient should be on Entresto if cost permits, when he is diuresed.  On treatment dose Lovenox, with eventual plan to transition to [place]. -Daily weights -Check O2 sats with ambulation - Start low dose Aldactone  -Continue Losartan  -Cardiology: Follow-up at advanced heart failure   Hyperlipidemia -Continue Atorvastatin 80 mg per primary  Social Issues  Patient has issues with care management.  Patient is unemployed and has no insurance.  He has not worked in over 10 years.  He reports that he has attempted to get assistance and been told he does not qualify. -Casework consult   FEN/GI: Per primary team Prophylaxis: Per primary  team  Disposition: Per primary team  Subjective: Doing well today. Denies chest pain, SOB or fevers. No new concerns.   Objective: Temp:  [97.4 F (36.3 C)-98.4 F (36.9 C)] 98.4 F (36.9 C) (12/01 2256) Pulse Rate:  [86-93] 87 (12/01 2256) BP: (102-109)/(75-88) 109/88 (12/01 2256) SpO2:  [98 %-99 %] 98 % (12/01 2256) Weight:  [138.8 kg] 138.8 kg (12/01 0700)  General: Alert and cooperative and appears to be in no acute distress HEENT: Neck non-tender without lymphadenopathy, masses or thyromegaly Cardio: Normal S1 and S2, RRR. No murmurs or rubs.   Pulm: Clear to auscultation bilaterally, no crackles, wheezing, or diminished breath sounds. Normal respiratory effort Abdomen: Bowel sounds normal. Abdomen soft and non-tender.  Extremities: mild peripheral edema up to shins. Warm/ well perfused.  Strong radial pulse. Neuro: Cranial nerves grossly intact  Labs and Imaging: CBC BMET  Recent Labs  Lab 08/13/19 0328  WBC 9.4  HGB 14.0  HCT 41.3  PLT 163   Recent Labs  Lab 08/12/19 0352  NA 137  K 3.6  CL 101  CO2 22  BUN 15  CREATININE 1.00  GLUCOSE 143*  CALCIUM 8.6*    Troponins-1047 (11/27) > 3525 (11/27)> 4988> 5226 BNP-469.2> 395.3  EKG on admission showed sinus tachycardia with anterior Q waves V1-V4, no acute ischemic changes seen.  Repeat EKG 11/28 showed normal sinus rhythm   Lattie Haw, MD 08/13/2019, 6:27 AM PGY-1, [place] Intern pager: [phone], text pages welcome

## 2019-08-13 NOTE — Progress Notes (Addendum)
Advanced Heart Failure Rounding Note  PCP-Cardiologist: Verne Carrowhristopher McAlhany, MD  [facility]: Dr. Gala RomneyBensimhon   Subjective:    Good diuresis yesterday. - 2.9 L out (received 40 po Lasix yesterday AM and 40 IV PM). Wt down and additional  5 lb from 306>>301 lb (admit wt 318 lb).   BMP pending.    BP soft but stable in the upper 90s. Feels a little "fuzzy headed" at times but no syncope/ near syncope. Breathing much improved. No further exertional dyspnea. Denies CP.    Objective:   Weight Range: (!) 136.9 kg Body mass index is 39.83 kg/m.   Vital Signs:   Temp:  [97.5 F (36.4 C)-98.9 F (37.2 C)] 98.9 F (37.2 C) (12/02 0652) Pulse Rate:  [87-96] 96 (12/02 0652) BP: (99-109)/(69-88) 99/69 (12/02 0652) SpO2:  [97 %-99 %] 97 % (12/02 0652) Weight:  [136.9 kg] 136.9 kg (12/02 0652) Last BM Date: 08/11/19  Weight change: Filed Weights   08/11/19 0648 08/12/19 0700 08/13/19 0652  Weight: (!) 139.3 kg (!) 138.8 kg (!) 136.9 kg    Intake/Output:   Intake/Output Summary (Last 24 hours) at 08/13/2019 0803 Last data filed at 08/13/2019 16100722 Gross per 24 hour  Intake 603 ml  Output 3400 ml  Net -2797 ml      Physical Exam    General:  Well appearing, obese WM. No resp difficulty HEENT: Normal anicteric  Neck: Supple. JVP hard to see. Doesn't look overly elevated. . Carotids 2+ bilat; no bruits. No lymphadenopathy or thyromegaly appreciated. Cor: PMI nondisplaced. Regular rate & rhythm. No rubs, gallops or murmurs. Lungs: Clear Abdomen: obese Soft, nontender, nondistended. No hepatosplenomegaly. No bruits or masses. Good bowel sounds. Extremities: No cyanosis, clubbing, rash, 1-2+ bilateral LE edema Neuro: alert & oriented x 3, cranial nerves grossly intact. moves all 4 extremities w/o difficulty. Affect pleasant   Telemetry   NSR 80s   EKG    No new EKG to review  Labs    CBC Recent Labs    08/12/19 0352 08/13/19 0328  WBC 8.1 9.4  HGB 13.8 14.0  HCT 41.6  41.3  MCV 88.5 87.1  PLT 152 163   Basic Metabolic Panel Recent Labs    96/12/5410/01/20 0352  NA 137  K 3.6  CL 101  CO2 22  GLUCOSE 143*  BUN 15  CREATININE 1.00  CALCIUM 8.6*   Liver Function Tests No results for input(s): AST, ALT, ALKPHOS, BILITOT, PROT, ALBUMIN in the last 72 hours. No results for input(s): LIPASE, AMYLASE in the last 72 hours. Cardiac Enzymes No results for input(s): CKTOTAL, CKMB, CKMBINDEX, TROPONINI in the last 72 hours.  BNP: BNP (last 3 results) Recent Labs    08/08/19 0454 08/08/19 0625  BNP 469.2* 395.3*    ProBNP (last 3 results) No results for input(s): PROBNP in the last 8760 hours.   D-Dimer No results for input(s): DDIMER in the last 72 hours. Hemoglobin A1C No results for input(s): HGBA1C in the last 72 hours. Fasting Lipid Panel No results for input(s): CHOL, HDL, LDLCALC, TRIG, CHOLHDL, LDLDIRECT in the last 72 hours. Thyroid Function Tests No results for input(s): TSH, T4TOTAL, T3FREE, THYROIDAB in the last 72 hours.  Invalid input(s): FREET3  Other results:   Imaging    Dg Chest 2 View  Result Date: 08/13/2019 CLINICAL DATA:  Shortness of breath EXAM: CHEST - 2 VIEW COMPARISON:  August 08, 2019 FINDINGS: There is no edema or consolidation. Heart is enlarged with pulmonary vascularity  normal. No adenopathy. No bone lesions. IMPRESSION: Cardiomegaly.  No edema or consolidation.  No adenopathy evident. Electronically Signed   By: Lowella Grip III M.D.   On: 08/13/2019 07:27      Medications:     Scheduled Medications: . aspirin EC  81 mg Oral Daily  . atorvastatin  80 mg Oral q1800  . enoxaparin (LOVENOX) injection  140 mg Subcutaneous Q12H  . furosemide  40 mg Oral Daily  . insulin aspart  0-15 Units Subcutaneous TID WC  . insulin aspart  0-5 Units Subcutaneous QHS  . insulin glargine  20 Units Subcutaneous QHS  . isosorbide mononitrate  15 mg Oral Daily  . LORazepam  1 mg Intravenous Once  . losartan  12.5  mg Oral Daily  . metoprolol succinate  25 mg Oral Daily  . sodium chloride flush  3 mL Intravenous Q12H  . spironolactone  12.5 mg Oral Daily     Infusions: . sodium chloride       PRN Medications:  sodium chloride, acetaminophen, nitroGLYCERIN, ondansetron (ZOFRAN) IV, oxyCODONE, sodium chloride, sodium chloride flush    Patient Profile   Mr. 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 Turned down for CABG. Needs HF optimization and better control of DM prior to being reconsidered for surgical revascularization.   Assessment/Plan   1. CAD: admitted for NSTEMI. Troponin peaking > 5K. LHC w/ multivessel CAD noted above. EF severely reduced by echo and cMRI (19%). cMRI w/ greater than 50% nonviable myocardium in the inferior wall, apical anterior wall, and the apical-lateral wall. The only area of viability was in the mid anterior-lateral wall corresponding to the patent ramus branch of the left coronary. Seen by Dr. Prescott Gum and not felt to be great candidate for CABG at this time. Would be high risk for poor surgical outcome given poorly controlled DM. Needs better control of DM and HF optimization prior to being reconsidered for CABG.  - Currently CP free on Medical therapy.  - Continue ASA, high potency statin, ? blocker and Imdur   2. Acute Systolic HF/ Ischemic CM: EF~20%. Treat w/ GDMT. - Plan to hold losartan today due to soft BP.  - Continue Toprol XL 25 daily  - Continue Spironolactone. Will titrate to 25 mg daily - Imdur 15 mg  - Add digoxin 0.125 mg daily - Would benefit from addition of a SGLTi (Jardiance vs [place]) - Volume improving. Good diuresis yesterday. - 2.9 L out (received 40 po Lasix yesterday AM and 40 IV PM). Wt down and additional  5 lb from 306>>301 lb (admit wt 318 lb).  - Continue 40 mg  of lasix daily  - Limited resources. Will need medication assistance through HF fund. Given transportation issues may need SW assistance getting to f/u office visits  - Unfortunately will not qualify for paramedicine as he lives in [place]. DM: hgb A1c 10.2. Goal <7.0  - currently on insulin - will ultimately need metformin  - Plan to start a SGTLi in the near future - needs to establish care w/ PCP post hospital   4. HLD: LDL 210 mg/dL. Goal LDL < 70 - high intensity statin started, atorvastatin 80 mg nightly - needs repeat FLP and HFTs in 6-8 weeks  - if not at target goal, consider addition of Zetia  or PCSK9i   5. Obesity: Body mass index is 40.37 kg/m.  - wt loss/ diet modification needed - would benefit from cardiac rehab  6. HTN: BP goal in setting of DM and CAD < 130/80.  - controlled on current regimen for HF/CAD  7. Probable small apical thrombus: noted on echo. Will need a/c - will discuss options w/ MD and PharmD.     Length of Stay: [address] PA-C  08/13/2019, 8:03 AM  Advanced Heart Failure Team Pager [phone] (M-F; 7a - 4p)  Please contact [facility] for night-coverage after hours (4p -7a ) and weekends on [email or web address]  Patient seen and examined with the above-signed Advanced Practice Provider and/or Housestaff. I personally reviewed laboratory data, imaging studies and relevant notes. I independently examined the patient and formulated the important aspects of the plan. I have edited the note to reflect any of my changes or salient points. I have personally discussed the plan with the patient and/or family.  Says he feels good today. Able to walk halls without problem. BP low in the 90s. Mild "fuzzyheadedness". No CP or SOB.  Still with some LE edema.   Will hold losartan. Place TED hose. Consolidate meds. We are working with SW and PharmD to get meds consolidated and get meds from [employer]. We will provide him a scale.   Echo appears to  have small LV clot. I reviewed MRI with Dr. Jerene Pitch  and not well seen. Will likely need Eliquis x 3 months. (unable to do warfarin due to transportation issues).   Defer possible CABG until further HF tune-up.   Total time spent 40 minutes. Over half that time spent discussing above.    Michael Meres, MD  9:04 AM

## 2019-08-13 NOTE — Telephone Encounter (Addendum)
Received message that patient needs patient assistance for Jardiance. Patient assistance form filled out. Will send in application once patient and provider signatures are obtained.   Audry Riles, PharmD, BCPS, BCCP, CPP Heart Failure Clinic Pharmacist [phone]

## 2019-08-14 ENCOUNTER — Encounter (HOSPITAL_COMMUNITY): Payer: Self-pay | Admitting: Physician Assistant

## 2019-08-14 DIAGNOSIS — I513 Intracardiac thrombosis, not elsewhere classified: Secondary | ICD-10-CM

## 2019-08-14 DIAGNOSIS — I255 Ischemic cardiomyopathy: Secondary | ICD-10-CM

## 2019-08-14 DIAGNOSIS — I2510 Atherosclerotic heart disease of native coronary artery without angina pectoris: Secondary | ICD-10-CM

## 2019-08-14 DIAGNOSIS — I10 Essential (primary) hypertension: Secondary | ICD-10-CM

## 2019-08-14 DIAGNOSIS — E785 Hyperlipidemia, unspecified: Secondary | ICD-10-CM

## 2019-08-14 LAB — BASIC METABOLIC PANEL
Anion gap: 10 (ref 5–15)
BUN: 15 mg/dL (ref 6–20)
CO2: 26 mmol/L (ref 22–32)
Calcium: 8.6 mg/dL — ABNORMAL LOW (ref 8.9–10.3)
Chloride: 101 mmol/L (ref 98–111)
Creatinine, Ser: 1.19 mg/dL (ref 0.61–1.24)
GFR calc Af Amer: >60 mL/min (ref >60)
GFR calc non Af Amer: >60 mL/min (ref >60)
Glucose, Bld: 149 mg/dL — ABNORMAL HIGH (ref 70–99)
Potassium: 3.7 mmol/L (ref 3.5–5.1)
Sodium: 137 mmol/L (ref 135–145)

## 2019-08-14 LAB — GLUCOSE, CAPILLARY: Glucose-Capillary: 169 mg/dL — ABNORMAL HIGH (ref 70–99)

## 2019-08-14 MED ORDER — DIGOXIN 125 MCG PO TABS: 0.1250 mg | ORAL_TABLET | Freq: Every day | ORAL | 6 refills | Status: DC

## 2019-08-14 MED ORDER — METFORMIN HCL 1000 MG PO TABS: 1000.0000 mg | ORAL_TABLET | Freq: Every day | ORAL | 11 refills | Status: DC

## 2019-08-14 MED ORDER — ISOSORBIDE MONONITRATE ER 30 MG PO TB24: 15.0000 mg | ORAL_TABLET | Freq: Every day | ORAL | 6 refills | Status: DC

## 2019-08-14 MED ORDER — FUROSEMIDE 40 MG PO TABS: 40.0000 mg | ORAL_TABLET | Freq: Every day | ORAL | 6 refills | Status: DC

## 2019-08-14 MED ORDER — METOPROLOL SUCCINATE ER 25 MG PO TB24: 25.0000 mg | ORAL_TABLET | Freq: Every day | ORAL | 6 refills | Status: DC

## 2019-08-14 MED ORDER — ATORVASTATIN CALCIUM 80 MG PO TABS: 80.0000 mg | ORAL_TABLET | Freq: Every day | ORAL | 6 refills | Status: DC

## 2019-08-14 MED ORDER — NITROGLYCERIN 0.4 MG SL SUBL: 0.4000 mg | SUBLINGUAL_TABLET | SUBLINGUAL | 12 refills | Status: DC | PRN

## 2019-08-14 MED ORDER — SPIRONOLACTONE 25 MG PO TABS: 25.0000 mg | ORAL_TABLET | Freq: Every day | ORAL | 6 refills | Status: DC

## 2019-08-14 MED ORDER — APIXABAN 5 MG PO TABS: 5.0000 mg | ORAL_TABLET | Freq: Two times a day (BID) | ORAL | 0 refills | Status: DC

## 2019-08-14 MED ORDER — JARDIANCE 10 MG PO TABS: 10.0000 mg | ORAL_TABLET | Freq: Every day | ORAL | 6 refills | Status: DC

## 2019-08-14 MED ORDER — ASPIRIN 81 MG PO TBEC: 81.0000 mg | DELAYED_RELEASE_TABLET | Freq: Every day | ORAL | 6 refills | Status: AC

## 2019-08-14 MED FILL — SPIRONOLACTONE 25 MG TABLET: 25 | 30 days supply | Qty: 30 | Fill #0

## 2019-08-14 MED FILL — ATORVASTATIN CALCIUM 80 MG: 80 | 30 days supply | Qty: 30 | Fill #0

## 2019-08-14 MED FILL — ISOSORBIDE MN ER 30 MG TAB: 30 | 30 days supply | Qty: 15 | Fill #0

## 2019-08-14 MED FILL — FUROSEMIDE 40 MG TABLET: 40 | 30 days supply | Qty: 30 | Fill #0

## 2019-08-14 MED FILL — metFORMIN HCL 1000 MG TABS: 1000 | 30 days supply | Qty: 30 | Fill #0

## 2019-08-14 MED FILL — DIGOXIN 0.125 MG TABLET: 125 | 30 days supply | Qty: 30 | Fill #0

## 2019-08-14 MED FILL — ELIQUIS 5 MG TABLET: 5 | 30 days supply | Qty: 60 | Fill #0

## 2019-08-14 MED FILL — METOPROLOL SUCCINATE ER 25: 25 | 30 days supply | Qty: 30 | Fill #0

## 2019-08-14 MED FILL — JARDIANCE 10 MG TABLET: 10 | 30 days supply | Qty: 30 | Fill #0

## 2019-08-14 MED FILL — ASPIRIN LOW DOSE 81 MG TBEC: 81 | 30 days supply | Qty: 30 | Fill #0

## 2019-08-14 MED FILL — NITROGLYCERIN 0.4 MG TAB SL: 0.4 | 8 days supply | Qty: 25 | Fill #0

## 2019-08-14 NOTE — Progress Notes (Signed)
Pt sts he has been ambulating and eager to d/c. Reviewed HF ed with pt and he was able to perform teach back.  [place] CES, ACSM 9:43 AM 08/14/2019

## 2019-08-14 NOTE — Telephone Encounter (Signed)
Patient Advocate Encounter  Completed application for [employer] Patient Assistance Program sent in an effort to reduce the patient's out of pocket expense for Jardiance to $0.    Application completed and faxed to [phone]  [facility] Cares patient assistance phone number for follow up is [phone].   This encounter will be updated until final determination.  Audry Riles, PharmD, BCPS, BCCP, CPP Heart Failure Clinic Pharmacist [phone]

## 2019-08-14 NOTE — Consult Note (Signed)
[facility] Consult Note  Service Pager: [phone]  Patient name: Michael [name] Medical record number: [id number] Date of birth: 09-27-1960 Age: 58 y.o. Gender: male  Primary Care Provider: Patient, No Pcp Per Code Status: Full Code  Preferred Emergency Contact: Thomas [phone]  Chief Complaint: Chest pain   Assessment and Plan: [name] is a 58 y.o. male presenting with chest pain and dyspnea.  Patient has no known past medical history.  He has not seen a physician in over 10 years.   Type 2 diabetes mellitus Doing well today no new concerns.  HBA1c:10.4 on admission CBGs 165, 160 last 24 hours -Continue Lantus at 20 units to discharge -Moderate sliding scale -CBGs with before meals and at bedtime -Diabetes education -Metformin XR 500mg  once daily, Empagliflozin 10mg  once daily, Lantus 15 units on discharge.    Chest pain/NSTEMI Primary team managing -Follow primary team's recs -Cardiology would not recommend CABG at this time: High risk for poor surgical outcome, recommends medical therapy for ischemic cardiomyopathy.  -Asprin, Atorvastatin 40 mg daily -Carvediolol d/c, started metoprolol 25mg  on 11/30   Acute HFrEF/Apical Thrombus Left heart cath after admission showed LVEDP 32-35 mmHg.  LVEF of 25 to 35%.  Primary team started Lasix 40 mg IV daily.  Per cardiology, patient should be on Entresto if cost permits, when he is diuresed.  On treatment dose Lovenox, with eventual plan to transition to [place]. -Daily weights -Check O2 sats with ambulation - Start low dose Aldactone  -Continue Losartan  -Cardiology: Follow-up at advanced heart failure   Hyperlipidemia -Continue Atorvastatin 80 mg per primary  Social Issues  Patient has issues with care management.  Patient is unemployed and has no insurance.  He has not worked in over 10 years.  He reports that he has attempted to get assistance and been told he does not qualify. -Casework  consult   FEN/GI: Per primary team Prophylaxis: Per primary team  Disposition: Per primary team  Subjective: Doing well today no new concerns.   Objective: Temp:  [98.2 F (36.8 C)-98.9 F (37.2 C)] 98.2 F (36.8 C) (12/02 2158) Pulse Rate:  [84-96] 84 (12/02 2158) Resp:  [18] 18 (12/02 1341) BP: (92-102)/(62-77) 92/62 (12/02 2158) SpO2:  [97 %-98 %] 98 % (12/02 2158) Weight:  [136.9 kg] 136.9 kg (12/02 1025)  General: Alert and cooperative and appears to be in no acute distress Cardio: Normal S1 and S2, no S3 or S4. RRR, No murmurs or rubs.   Pulm: CTAB no crackles, wheezing, or diminished breath sounds. Normal respiratory effort Abdomen: Bowel sounds normal. Abdomen soft and non-tender.  Extremities: Peripheral edema markedly improved upto ankles bilaterally. warm/ well perfused.  Strong radial pulse Neuro: Cranial nerves grossly intact  Labs and Imaging: CBC BMET  Recent Labs  Lab 08/13/19 0328  WBC 9.4  HGB 14.0  HCT 41.3  PLT 163   Recent Labs  Lab 08/14/19 0339  NA 137  K 3.7  CL 101  CO2 26  BUN 15  CREATININE 1.19  GLUCOSE 149*  CALCIUM 8.6*    Troponins-1047 (11/27) > 3525 (11/27)> 4988> 5226 BNP-469.2> 395.3  EKG on admission showed sinus tachycardia with anterior Q waves V1-V4, no acute ischemic changes seen.  Repeat EKG 11/28 showed normal sinus rhythm   Lattie Haw, MD 08/14/2019, 6:38 AM PGY-1, [place] Intern pager: [phone], text pages welcome

## 2019-08-14 NOTE — Progress Notes (Addendum)
Advanced Heart Failure Rounding Note  PCP-Cardiologist: Verne Carrow, MD  [facility]: Dr. Gala Romney   Subjective:    Yesterday losartan stopped.due to some dizziness. Weight stable on po lasix.   Demanding to go home.  Denies SOB. Not able to sleep well in the hospital. Denies orthopnea or PND. No further CP.   Objective:   Weight Range: (!) 136.8 kg Body mass index is 39.79 kg/m.   Vital Signs:   Temp:  [98.2 F (36.8 C)-98.4 F (36.9 C)] 98.4 F (36.9 C) (12/03 0643) Pulse Rate:  [84-95] 86 (12/03 0643) Resp:  [18] 18 (12/02 1341) BP: (92-103)/(58-77) 103/58 (12/03 0643) SpO2:  [98 %] 98 % (12/03 0643) Weight:  [136.8 kg] 136.8 kg (12/03 0643) Last BM Date: 08/12/19  Weight change: Filed Weights   08/12/19 0700 08/13/19 0652 08/14/19 0643  Weight: (!) 138.8 kg (!) 136.9 kg (!) 136.8 kg    Intake/Output:   Intake/Output Summary (Last 24 hours) at 08/14/2019 0749 Last data filed at 08/13/2019 1800 Gross per 24 hour  Intake 723 ml  Output 450 ml  Net 273 ml      Physical Exam    General:  Sitting in the chair.  No resp difficulty HEENT: normal anicteric  Neck: supple. JVPF 8-9. Carotids 2+ bilat; no bruits. No lymphadenopathy or thryomegaly appreciated. Cor: PMI nondisplaced. Regular rate & rhythm. No rubs, gallops or murmurs. Lungs: clear no wheeze Abdomen: obese soft, nontender, nondistended. No hepatosplenomegaly. No bruits or masses. Good bowel sounds. Extremities: no cyanosis, clubbing, rash,Rand LLE trace-1+ edema Neuro: alert & orientedx3, cranial nerves grossly intact. moves all 4 extremities w/o difficulty. Affect pleasant   Telemetry   NSR 80s  Personally reviewed   EKG    No new EKG to review  Labs    CBC Recent Labs    08/12/19 0352 08/13/19 0328  WBC 8.1 9.4  HGB 13.8 14.0  HCT 41.6 41.3  MCV 88.5 87.1  PLT 152 163   Basic Metabolic Panel Recent Labs    77/41/28 0328 08/14/19 0339  NA 136 137  K 3.7 3.7  CL 98  101  CO2 27 26  GLUCOSE 144* 149*  BUN 13 15  CREATININE 1.17 1.19  CALCIUM 8.7* 8.6*   Liver Function Tests No results for input(s): AST, ALT, ALKPHOS, BILITOT, PROT, ALBUMIN in the last 72 hours. No results for input(s): LIPASE, AMYLASE in the last 72 hours. Cardiac Enzymes No results for input(s): CKTOTAL, CKMB, CKMBINDEX, TROPONINI in the last 72 hours.  BNP: BNP (last 3 results) Recent Labs    08/08/19 0454 08/08/19 0625  BNP 469.2* 395.3*    ProBNP (last 3 results) No results for input(s): PROBNP in the last 8760 hours.   D-Dimer No results for input(s): DDIMER in the last 72 hours. Hemoglobin A1C No results for input(s): HGBA1C in the last 72 hours. Fasting Lipid Panel No results for input(s): CHOL, HDL, LDLCALC, TRIG, CHOLHDL, LDLDIRECT in the last 72 hours. Thyroid Function Tests No results for input(s): TSH, T4TOTAL, T3FREE, THYROIDAB in the last 72 hours.  Invalid input(s): FREET3  Other results:   Imaging    No results found.   Medications:     Scheduled Medications: . aspirin EC  81 mg Oral Daily  . atorvastatin  80 mg Oral q1800  . digoxin  0.125 mg Oral Daily  . enoxaparin (LOVENOX) injection  140 mg Subcutaneous Q12H  . furosemide  40 mg Oral Daily  . insulin aspart  0-15 Units Subcutaneous TID WC  . insulin aspart  0-5 Units Subcutaneous QHS  . insulin glargine  20 Units Subcutaneous QHS  . isosorbide mononitrate  15 mg Oral Daily  . LORazepam  1 mg Intravenous Once  . metoprolol succinate  25 mg Oral Daily  . sodium chloride flush  3 mL Intravenous Q12H  . spironolactone  25 mg Oral Daily    Infusions: . sodium chloride      PRN Medications: sodium chloride, acetaminophen, nitroGLYCERIN, ondansetron (ZOFRAN) IV, oxyCODONE, sodium chloride, sodium chloride flush    Patient Profile   Mr. 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 Turned down for CABG. Needs HF optimization and better control of DM prior to being reconsidered for surgical revascularization.   Assessment/Plan   1. CAD: admitted for NSTEMI. Troponin peaking > 5K. LHC w/ multivessel CAD noted above. EF severely reduced by echo and cMRI (19%). cMRI w/ greater than 50% nonviable myocardium in the inferior wall, apical anterior wall, and the apical-lateral wall. The only area of viability was in the mid anterior-lateral wall corresponding to the patent ramus branch of the left coronary. Seen by Dr. Prescott Gum and not felt to be great candidate for CABG at this time. Would be high risk for poor surgical outcome given poorly controlled DM. Needs better control of DM and HF optimization prior to being reconsidered for CABG.  - No chest pain.  - Continue ASA, high potency statin, ? blocker and Imdur   2. Acute Systolic HF/ Ischemic CM: EF~20%. Treat w/ GDMT. - Volume status stable. Continue lasix 40 mg daily.  - Continue Toprol XL 25 daily  -Continue spiro 25 mg daily - Imdur 15 mg  - Continue digoxin 0.125 mg daily - Adding jardiance 10 mg daily   3. DM: hgb A1c 10.2. Goal <7.0  - currently on insulin.  Diabetes Coordinator.-Provided patient with a Relion meter, strips and lancets.   Living Well with Diabetes book.  Per Dr Venda Rodes metformin 1000 mg twice a day.  - Start jardiance 10 mg daily  - Can add insulin as an outpatient.    4. HLD: LDL 210 mg/dL. Goal LDL < 70 - high intensity statin started, atorvastatin 80 mg nightly - needs repeat FLP and HFTs in 6-8 weeks  - if not at target goal, consider addition of Zetia or PCSK9i   5. Obesity: Body mass index is 39.79 kg/m.  6. HTN: Stable.   7. Probable small apical thrombus: noted on echo. CMRI was carefully reviewed by Dr. Gilman Schmidt and Dr Haroldine Laws. Not felt to have thrombus but with HF/CAD will place on eliquis 5 mg  twice a day for 30 days then stop. .   No insurance. Will need meds from HF fund.  HF SW will assist with transportation. He will need 30 day supply of HF medications.   Discharge home today. He will need 30 day supply of medications. Will use TOC    Length of Stay: 6  Amy Clegg, NP  08/14/2019, 7:49 AM  Advanced Heart Failure Team Pager [phone] (M-F; 7a - 4p)  Please contact [facility] for night-coverage after hours (4p -7a ) and weekends on [email or web address]  Patient seen and examined with the above-signed Advanced Practice Provider and/or Housestaff. I personally reviewed laboratory data, imaging studies and relevant notes.  I independently examined the patient and formulated the important aspects of the plan. I have edited the note to reflect any of my changes or salient points. I have personally discussed the plan with the patient and/or family.  Feels good today. Walking halls. No CP, orthopnea or PND. BP low 100s off losartan.   Wants to go home.   I have reviewed echo and cMRI with imaging team. No LV thrombus seen on cMRI. Echo questionable. Will treat with Eliquis for 1 month in setting or recent infarct  D/c home today on  Metformin 1000 bid Jardiance 10 Spiro 25 Toprol XL 25  Digoxin 0.125 Lasix 40 daily   Add ARB back as BP tolerates. Will follow in clinic in re-evaluate for CABG as he improves. I reviewed with Dr. PVT and feels LAD target suitable for LIMA.   Arvilla Meresaniel Lebaron Bautch, MD  10:02 AM

## 2019-08-14 NOTE — Progress Notes (Addendum)
Upon report in shift change, pt stated " I am leaving as soon as my ride gets here, so you had better do whatever you need to do" I attempted to educate him on the importance of discharge orders and the doctor would come see him as soon as possible. I have made CHF team and Dunn, PA aware and will continue to educate patient.

## 2019-08-14 NOTE — Discharge Instructions (Signed)
Diabetes Mellitus and Nutrition, Adult When you have diabetes (diabetes mellitus), it is very important to have healthy eating habits because your blood sugar (glucose) levels are greatly affected by what you eat and drink. Eating healthy foods in the appropriate amounts, at about the same times every day, can help you:  Control your blood glucose.  Lower your risk of heart disease.  Improve your blood pressure.  Reach or maintain a healthy weight. Every person with diabetes is different, and each person has different needs for a meal plan. Your health care provider may recommend that you work with a diet and nutrition specialist (dietitian) to make a meal plan that is best for you. Your meal plan may vary depending on factors such as:  The calories you need.  The medicines you take.  Your weight.  Your blood glucose, blood pressure, and cholesterol levels.  Your activity level.  Other health conditions you have, such as heart or kidney disease. How do carbohydrates affect me? Carbohydrates, also called carbs, affect your blood glucose level more than any other type of food. Eating carbs naturally raises the amount of glucose in your blood. Carb counting is a method for keeping track of how many carbs you eat. Counting carbs is important to keep your blood glucose at a healthy level, especially if you use insulin or take certain oral diabetes medicines. It is important to know how many carbs you can safely have in each meal. This is different for every person. Your dietitian can help you calculate how many carbs you should have at each meal and for each snack. Foods that contain carbs include:  Bread, cereal, rice, pasta, and crackers.  Potatoes and corn.  Peas, beans, and lentils.  Milk and yogurt.  Fruit and juice.  Desserts, such as cakes, cookies, ice cream, and candy. How does alcohol affect me? Alcohol can cause a sudden decrease in blood glucose (hypoglycemia),  especially if you use insulin or take certain oral diabetes medicines. Hypoglycemia can be a life-threatening condition. Symptoms of hypoglycemia (sleepiness, dizziness, and confusion) are similar to symptoms of having too much alcohol. If your health care provider says that alcohol is safe for you, follow these guidelines:  Limit alcohol intake to no more than 1 drink per day for nonpregnant women and 2 drinks per day for men. One drink equals 12 oz of beer, 5 oz of wine, or 1 oz of hard liquor.  Do not drink on an empty stomach.  Keep yourself hydrated with water, diet soda, or unsweetened iced tea.  Keep in mind that regular soda, juice, and other mixers may contain a lot of sugar and must be counted as carbs. What are tips for following this plan?  Reading food labels  Start by checking the serving size on the "Nutrition Facts" label of packaged foods and drinks. The amount of calories, carbs, fats, and other nutrients listed on the label is based on one serving of the item. Many items contain more than one serving per package.  Check the total grams (g) of carbs in one serving. You can calculate the number of servings of carbs in one serving by dividing the total carbs by 15. For example, if a food has 30 g of total carbs, it would be equal to 2 servings of carbs.  Check the number of grams (g) of saturated and trans fats in one serving. Choose foods that have low or no amount of these fats.  Check the number of  milligrams (mg) of salt (sodium) in one serving. Most people should limit total sodium intake to less than 2,300 mg per day.  Always check the nutrition information of foods labeled as "low-fat" or "nonfat". These foods may be higher in added sugar or refined carbs and should be avoided.  Talk to your dietitian to identify your daily goals for nutrients listed on the label. Shopping  Avoid buying canned, premade, or processed foods. These foods tend to be high in fat, sodium,  and added sugar.  Shop around the outside edge of the grocery store. This includes fresh fruits and vegetables, bulk grains, fresh meats, and fresh dairy. Cooking  Use low-heat cooking methods, such as baking, instead of high-heat cooking methods like deep frying.  Cook using healthy oils, such as olive, canola, or sunflower oil.  Avoid cooking with butter, cream, or high-fat meats. Meal planning  Eat meals and snacks regularly, preferably at the same times every day. Avoid going long periods of time without eating.  Eat foods high in fiber, such as fresh fruits, vegetables, beans, and whole grains. Talk to your dietitian about how many servings of carbs you can eat at each meal.  Eat 4-6 ounces (oz) of lean protein each day, such as lean meat, chicken, fish, eggs, or tofu. One oz of lean protein is equal to: ? 1 oz of meat, chicken, or fish. ? 1 egg. ?  cup of tofu.  Eat some foods each day that contain healthy fats, such as avocado, nuts, seeds, and fish. Lifestyle  Check your blood glucose regularly.  Exercise regularly as told by your health care provider. This may include: ? 150 minutes of moderate-intensity or vigorous-intensity exercise each week. This could be brisk walking, biking, or water aerobics. ? Stretching and doing strength exercises, such as yoga or weightlifting, at least 2 times a week.  Take medicines as told by your health care provider.  Do not use any products that contain nicotine or tobacco, such as cigarettes and e-cigarettes. If you need help quitting, ask your health care provider.  Work with a Veterinary surgeon or diabetes educator to identify strategies to manage stress and any emotional and social challenges. Questions to ask a health care provider  Do I need to meet with a diabetes educator?  Do I need to meet with a dietitian?  What number can I call if I have questions?  When are the best times to check my blood glucose? Where to find more  information:  American Diabetes Association: diabetes.org  Academy of Nutrition and Dietetics: www.eatright.[employer] of Diabetes and Digestive and Kidney Diseases (NIH): CarFli[employer].tn Summary  A healthy meal plan will help you control your blood glucose and maintain a healthy lifestyle.  Working with a diet and nutrition specialist (dietitian) can help you make a meal plan that is best for you.  Keep in mind that carbohydrates (carbs) and alcohol have immediate effects on your blood glucose levels. It is important to count carbs and to use alcohol carefully. This information is not intended to replace advice given to you by your health care provider. Make sure you discuss any questions you have with your health care provider. Document Released: 05/25/2005 Document Revised: 08/10/2017 Document Reviewed: 10/02/2016 Elsevier Patient Education  2020 Else[employer] Inc[employer]    Diabetes Basics  Diabetes (diabetes mellitus) is a long-term (chronic) disease. It occurs when the body does not properly use sugar (glucose) that is released from food after you eat. Diabetes may be  caused by one or both of these problems:  Your pancreas does not make enough of a hormone called insulin.  Your body does not react in a normal way to insulin that it makes. Insulin lets sugars (glucose) go into cells in your body. This gives you energy. If you have diabetes, sugars cannot get into cells. This causes high blood sugar (hyperglycemia). Follow these instructions at home: How is diabetes treated? You may need to take insulin or other diabetes medicines daily to keep your blood sugar in balance. Take your diabetes medicines every day as told by your doctor. List your diabetes medicines here: Diabetes medicines  Name of medicine: ______________________________ ? Amount (dose): _______________ Time (a.m./p.m.): _______________ Notes: ___________________________________  Name of medicine:  ______________________________ ? Amount (dose): _______________ Time (a.m./p.m.): _______________ Notes: ___________________________________  Name of medicine: ______________________________ ? Amount (dose): _______________ Time (a.m./p.m.): _______________ Notes: ___________________________________ If you use insulin, you will learn how to give yourself insulin by injection. You may need to adjust the amount based on the food that you eat. List the types of insulin you use here: Insulin  Insulin type: ______________________________ ? Amount (dose): _______________ Time (a.m./p.m.): _______________ Notes: ___________________________________  Insulin type: ______________________________ ? Amount (dose): _______________ Time (a.m./p.m.): _______________ Notes: ___________________________________  Insulin type: ______________________________ ? Amount (dose): _______________ Time (a.m./p.m.): _______________ Notes: ___________________________________  Insulin type: ______________________________ ? Amount (dose): _______________ Time (a.m./p.m.): _______________ Notes: ___________________________________  Insulin type: ______________________________ ? Amount (dose): _______________ Time (a.m./p.m.): _______________ Notes: ___________________________________ How do I manage my blood sugar?  Check your blood sugar levels using a blood glucose monitor as directed by your doctor. Your doctor will set treatment goals for you. Generally, you should have these blood sugar levels:  Before meals (preprandial): 80-130 mg/dL (4.0-9.8 mmol/L).  After meals (postprandial): below 180 mg/dL (10 mmol/L).  A1c level: less than 7%. Write down the times that you will check your blood sugar levels: Blood sugar checks  Time: _______________ Notes: ___________________________________  Time: _______________ Notes: ___________________________________  Time: _______________ Notes:  ___________________________________  Time: _______________ Notes: ___________________________________  Time: _______________ Notes: ___________________________________  Time: _______________ Notes: ___________________________________  What do I need to know about low blood sugar? Low blood sugar is called hypoglycemia. This is when blood sugar is at or below 70 mg/dL (3.9 mmol/L). Symptoms may include:  Feeling: ? Hungry. ? Worried or nervous (anxious). ? Sweaty and clammy. ? Confused. ? Dizzy. ? Sleepy. ? Sick to your stomach (nauseous).  Having: ? A fast heartbeat. ? A headache. ? A change in your vision. ? Tingling or no feeling (numbness) around the mouth, lips, or tongue. ? Jerky movements that you cannot control (seizure).  Having trouble with: ? Moving (coordination). ? Sleeping. ? Passing out (fainting). ? Getting upset easily (irritability). Treating low blood sugar To treat low blood sugar, eat or drink something sugary right away. If you can think clearly and swallow safely, follow the 15:15 rule:  Take 15 grams of a fast-acting carb (carbohydrate). Talk with your doctor about how much you should take.  Some fast-acting carbs are: ? Sugar tablets (glucose pills). Take 3-4 glucose pills. ? 6-8 pieces of hard candy. ? 4-6 oz (120-150 mL) of fruit juice. ? 4-6 oz (120-150 mL) of regular (not diet) soda. ? 1 Tbsp (15 mL) honey or sugar.  Check your blood sugar 15 minutes after you take the carb.  If your blood sugar is still at or below 70 mg/dL (3.9 mmol/L), take 15 grams of a carb again.  If your blood sugar does not go above 70 mg/dL (3.9 mmol/L) after 3 tries, get help right away.  After your blood sugar goes back to normal, eat a meal or a snack within 1 hour. Treating very low blood sugar If your blood sugar is at or below 54 mg/dL (3 mmol/L), you have very low blood sugar (severe hypoglycemia). This is an emergency. Do not wait to see if the symptoms  will go away. Get medical help right away. Call your local emergency services (911 in the [place] Do not drive yourself to the hospital. Questions to ask your health care provider  Do I need to meet with a diabetes educator?  What equipment will I need to care for myself at home?  What diabetes medicines do I need? When should I take them?  How often do I need to check my blood sugar?  What number can I call if I have questions?  When is my next doctor's visit?  Where can I find a support group for people with diabetes? Where to find more information  American Diabetes Association: www.diabetes.org  American Association of Diabetes Educators: www.diabeteseducator.org/patient-resources Contact a doctor if:  Your blood sugar is at or above 240 mg/dL (40.913.3 mmol/L) for 2 days in a row.  You have been sick or have had a fever for 2 days or more, and you are not getting better.  You have any of these problems for more than 6 hours: ? You cannot eat or drink. ? You feel sick to your stomach (nauseous). ? You throw up (vomit). ? You have watery poop (diarrhea). Get help right away if:  Your blood sugar is lower than 54 mg/dL (3 mmol/L).  You get confused.  You have trouble: ? Thinking clearly. ? Breathing. Summary  Diabetes (diabetes mellitus) is a long-term (chronic) disease. It occurs when the body does not properly use sugar (glucose) that is released from food after digestion.  Take insulin and diabetes medicines as told.  Check your blood sugar every day, as often as told.  Keep all follow-up visits as told by your doctor. This is important. This information is not intended to replace advice given to you by your health care provider. Make sure you discuss any questions you have with your health care provider. Document Released: 11/30/2017 Document Revised: 10/18/2018 Document Reviewed: 11/30/2017 Elsevier Patient Education  2020 Elsevier Inc.    Carbohydrate  Counting for Diabetes Mellitus, Adult  Carbohydrate counting is a method of keeping track of how many carbohydrates you eat. Eating carbohydrates naturally increases the amount of sugar (glucose) in the blood. Counting how many carbohydrates you eat helps keep your blood glucose within normal limits, which helps you manage your diabetes (diabetes mellitus). It is important to know how many carbohydrates you can safely have in each meal. This is different for every person. A diet and nutrition specialist (registered dietitian) can help you make a meal plan and calculate how many carbohydrates you should have at each meal and snack. Carbohydrates are found in the following foods:  Grains, such as breads and cereals.  Dried beans and soy products.  Starchy vegetables, such as potatoes, peas, and corn.  Fruit and fruit juices.  Milk and yogurt.  Sweets and snack foods, such as cake, cookies, candy, chips, and soft drinks. How do I count carbohydrates? There are two ways to count carbohydrates in food. You can use either of the methods or a combination of both. Reading "Nutrition Facts" on  packaged food The "Nutrition Facts" list is included on the labels of almost all packaged foods and beverages in the [place] It includes:  The serving size.  Information about nutrients in each serving, including the grams (g) of carbohydrate per serving. To use the Nutrition Facts":  Decide how many servings you will have.  Multiply the number of servings by the number of carbohydrates per serving.  The resulting number is the total amount of carbohydrates that you will be having. Learning standard serving sizes of other foods When you eat carbohydrate foods that are not packaged or do not include "Nutrition Facts" on the label, you need to measure the servings in order to count the amount of carbohydrates:  Measure the foods that you will eat with a food scale or measuring cup, if needed.  Decide  how many standard-size servings you will eat.  Multiply the number of servings by 15. Most carbohydrate-rich foods have about 15 g of carbohydrates per serving. ? For example, if you eat 8 oz (170 g) of strawberries, you will have eaten 2 servings and 30 g of carbohydrates (2 servings x 15 g = 30 g).  For foods that have more than one food mixed, such as soups and casseroles, you must count the carbohydrates in each food that is included. The following list contains standard serving sizes of common carbohydrate-rich foods. Each of these servings has about 15 g of carbohydrates:   hamburger bun or  English muffin.   oz (15 mL) syrup.   oz (14 g) jelly.  1 slice of bread.  1 six-inch tortilla.  3 oz (85 g) cooked rice or pasta.  4 oz (113 g) cooked dried beans.  4 oz (113 g) starchy vegetable, such as peas, corn, or potatoes.  4 oz (113 g) hot cereal.  4 oz (113 g) mashed potatoes or  of a large baked potato.  4 oz (113 g) canned or frozen fruit.  4 oz (120 mL) fruit juice.  4-6 crackers.  6 chicken nuggets.  6 oz (170 g) unsweetened dry cereal.  6 oz (170 g) plain fat-free yogurt or yogurt sweetened with artificial sweeteners.  8 oz (240 mL) milk.  8 oz (170 g) fresh fruit or one small piece of fruit.  24 oz (680 g) popped popcorn. Example of carbohydrate counting Sample meal  3 oz (85 g) chicken breast.  6 oz (170 g) brown rice.  4 oz (113 g) corn.  8 oz (240 mL) milk.  8 oz (170 g) strawberries with sugar-free whipped topping. Carbohydrate calculation 1. Identify the foods that contain carbohydrates: ? Rice. ? Corn. ? Milk. ? Strawberries. 2. Calculate how many servings you have of each food: ? 2 servings rice. ? 1 serving corn. ? 1 serving milk. ? 1 serving strawberries. 3. Multiply each number of servings by 15 g: ? 2 servings rice x 15 g = 30 g. ? 1 serving corn x 15 g = 15 g. ? 1 serving milk x 15 g = 15 g. ? 1 serving strawberries x  15 g = 15 g. 4. Add together all of the amounts to find the total grams of carbohydrates eaten: ? 30 g + 15 g + 15 g + 15 g = 75 g of carbohydrates total. Summary  Carbohydrate counting is a method of keeping track of how many carbohydrates you eat.  Eating carbohydrates naturally increases the amount of sugar (glucose) in the blood.  Counting how many carbohydrates you  eat helps keep your blood glucose within normal limits, which helps you manage your diabetes.  A diet and nutrition specialist (registered dietitian) can help you make a meal plan and calculate how many carbohydrates you should have at each meal and snack. This information is not intended to replace advice given to you by your health care provider. Make sure you discuss any questions you have with your health care provider. Document Released: 08/28/2005 Document Revised: 03/22/2017 Document Reviewed: 02/09/2016 Elsevier Patient Education  2020 [employer].

## 2019-08-14 NOTE — Discharge Summary (Addendum)
Advanced Heart Failure Team  Discharge Summary   Patient ID: Michael [name] MRN: [id number], DOB/AGE: 56-30-1962 56 y.o. Admit date: 08/08/2019 D/C date:     08/14/2019   Primary Discharge Diagnoses:  1. CAD/NSTEMI 2. Acute Systolic HF/ Ischemic CM 3. DM  4. HLD 5. Obesity 6. HTN 7. Probable small apical thrombus  Hospital Course:  Mr [name] is a 58 year old with history of tobacco abuse (chews tobacco)  and no known cardiac disease.   Admitted with increased shortness of breath and chest pain. HS Trop [id number]. Also had hgb A1C 10.2.  ECHO showed EF 25-30% and elevated LVEDP.  Started on heparin drip and taken for LHC that showed severe 2 vessel disease and elevated LVEDP. CMRI showed LVEF 19%, 50% transmural late gadolinium enhancement suggesting nonviability in the basal to apical inferior walls.  CT consulted for possible CABG. He was not thought to be a candidate for CABG at this time due to uncontrolled diabetes.   Diuresed with IV lasix and transitioned to lasix 40 mg daily. HF meds initiated. He was intolerant losartan due some dizziness.  May be able to re-challenge as an outpatient.    Referred to family medicine for assistance diabetes. Hgb A1C 10.2. On insulin in the hospital but switched to jardiance and metformin at discharge. Needs to get under control prior to CABG.   See below for detailed problems list. He will contine to be followed closely in the HF clinic. He was provided with a 30 day supply of all medications prior to discharge. HF SW will assist with transporation to the clinic. He has many social barriers due to no income.   Items for outpatient follow up---> Stop Imdur after 1 month, stop eliquis after 1 month, and verify transportation for appointments including familly medicine. We will need to make sure he can get all medications at every appointment. HFSW will follow in the community.   1. CAD: admitted for NSTEMI. Troponin peaking > 5K. LHC w/ multivessel  CAD noted above. EF severely reduced by echo and cMRI (19%). cMRI w/greater than 50% nonviable myocardium in the inferior wall, apical anterior wall, and the apical-lateral wall. The only area of viability was in the mid anterior-lateral wall corresponding to the patent ramus branch of the left coronary.Seen by Dr. Prescott Gum and not felt to be great candidate for CABG at this time. Would be high risk for poor surgical outcome given poorly controlled DM. Needs better control of DM and HF optimization prior to being reconsidered for CABG.  - No chest pain. Continue ASA, high potency statin,?blocker and Imdur   2. Acute Systolic HF/ Ischemic CM: EF~20%. - Diuresed with IV lasix and transitioned to lasix 40 mg daily.   - Continue Toprol XL 25 daily  -Continue spiro 25 mg daily - Imdur 15 mg  - Continue digoxin 0.125 mg daily - Adding jardiance 10 mg daily  - Intolerant losartan but may be able to re-challenge down the road.   3. DM, new : hgb A1c 10.2. Goal <6.0  - Placed on insulin while hospitalized.  Diabetes Coordinator consulted -Provided patient with a Relion meter, strips and lancets. May need insulin later. Given Living Well with Diabetes book.  Per Dr Venda Rodes metformin 1000 mg twice a day and jardiance 10 mg daily at discharge.     4. HLD:LDL 210 mg/dL. Goal LDL <70 - high intensity statin started, atorvastatin 80 mg nightly - needs repeat FLP and HFTs in 6-8 weeks  -  if not at target goal, consider addition of Zetia or PCSK9i   5. Obesity:Body mass index is 39.79 kg/m.  6. HTN: Stable.   7. Probable small apical thrombus Noted on echo. CMRI was carefully reviewed by Radiology and Dr Gala RomneyBensimhon. Not felt to have thrombus but with reduced HF/CAD will place on eliquis 5 mg twice a day for 30 days then stop.   Discharge Weight: 301.6 pounds. Discharge Vitals: Blood pressure 131/88, pulse 86, temperature 98.4 F (36.9 C), temperature source Oral, resp. rate  18, height 6\' 1"  (1.854 m), weight (!) 136.8 kg, SpO2 98 %.  Labs: Lab Results  Component Value Date   WBC 9.4 08/13/2019   HGB 14.0 08/13/2019   HCT 41.3 08/13/2019   MCV 87.1 08/13/2019   PLT 163 08/13/2019    Recent Labs  Lab 08/10/19 0323  08/14/19 0339  NA 137   < > 137  K 3.5   < > 3.7  CL 100   < > 101  CO2 28   < > 26  BUN 22*   < > 15  CREATININE 1.14   < > 1.19  CALCIUM 8.5*   < > 8.6*  PROT 5.7*  --   --   BILITOT 0.9  --   --   ALKPHOS 51  --   --   ALT 30  --   --   AST 17  --   --   GLUCOSE 165*   < > 149*   < > = values in this interval not displayed.   Lab Results  Component Value Date   CHOL 269 (H) 08/08/2019   HDL 41 08/08/2019   LDLCALC 210 (H) 08/08/2019   TRIG 89 08/08/2019   BNP (last 3 results) Recent Labs    08/08/19 0454 08/08/19 0625  BNP 469.2* 395.3*    ProBNP (last 3 results) No results for input(s): PROBNP in the last 8760 hours.   Diagnostic Studies/Procedures   LHC 08/08/19   Acute on chronic combined systolic and diastolic heart failure, EF 25 to 35%, with LVEDP 32 to 35 mmHg.  Widely patent left main  Diffusely diseased proximal to mid LAD up to 90% stenosis throughout the segment.  Diffuse disease mid to distal LAD without focal high-grade obstruction.  First diagonal is relatively large but contains severe proximal to mid diffuse disease, greater than 85%.  Large ramus intermedius that supplies collaterals to the distal right coronary.  30% proximal narrowing.  Circumflex contains diffuse 50% proximal narrowing.  First obtuse marginal is 85 to 90%.  Right coronary arises from a separate ostium than the conus branch.  RCA contains proximal to mid eccentric 85% stenosis.  PDA contains proximal to mid severe diffuse disease with competition from left to right collaterals.  The continuation of the right coronary beyond the PDA contains high-grade obstruction with right to right and left to right collaterals noted. Dg  Chest 2 View  Result Date: 08/13/2019 CLINICAL DATA:  Shortness of breath EXAM: CHEST - 2 VIEW COMPARISON:  August 08, 2019 FINDINGS: There is no edema or consolidation. Heart is enlarged with pulmonary vascularity normal. No adenopathy. No bone lesions. IMPRESSION: Cardiomegaly.  No edema or consolidation.  No adenopathy evident. Electronically Signed   By: Bretta BangWilliam  Woodruff III M.D.   On: 08/13/2019 07:27    Discharge Medications   Allergies as of 08/14/2019   No Known Allergies     Medication List    STOP taking these medications  aspirin 325 MG tablet Replaced by: aspirin 81 MG EC tablet     TAKE these medications   apixaban 5 MG Tabs tablet Commonly known as: Eliquis Take 1 tablet (5 mg total) by mouth 2 (two) times daily.   aspirin 81 MG EC tablet Take 1 tablet (81 mg total) by mouth daily. Start taking on: August 15, 2019 Replaces: aspirin 325 MG tablet   atorvastatin 80 MG tablet Commonly known as: LIPITOR Take 1 tablet (80 mg total) by mouth daily at 6 PM.   digoxin 0.125 MG tablet Commonly known as: LANOXIN Take 1 tablet (0.125 mg total) by mouth daily. Start taking on: August 15, 2019   furosemide 40 MG tablet Commonly known as: LASIX Take 1 tablet (40 mg total) by mouth daily. Start taking on: August 15, 2019   isosorbide mononitrate 30 MG 24 hr tablet Commonly known as: IMDUR Take 0.5 tablets (15 mg total) by mouth daily. Start taking on: August 15, 2019   Jardiance 10 MG Tabs tablet Generic drug: empagliflozin Take 10 mg by mouth daily before breakfast.   metFORMIN 1000 MG tablet Commonly known as: Glucophage Take 1 tablet (1,000 mg total) by mouth daily with breakfast.   metoprolol succinate 25 MG 24 hr tablet Commonly known as: TOPROL-XL Take 1 tablet (25 mg total) by mouth daily. Start taking on: August 15, 2019   nitroGLYCERIN 0.4 MG SL tablet Commonly known as: NITROSTAT Place 1 tablet (0.4 mg total) under the tongue every 5  (five) minutes x 3 doses as needed for chest pain.   spironolactone 25 MG tablet Commonly known as: ALDACTONE Take 1 tablet (25 mg total) by mouth daily. Start taking on: August 15, 2019       Disposition   The patient will be discharged in stable condition to home. Discharge Instructions    (HEART FAILURE PATIENTS) Call MD:  Anytime you have any of the following symptoms: 1) 3 pound weight gain in 24 hours or 5 pounds in 1 week 2) shortness of breath, with or without a dry hacking cough 3) swelling in the hands, feet or stomach 4) if you have to sleep on extra pillows at night in order to breathe.   Complete by: As directed    Amb Referral to Cardiac Rehabilitation   Complete by: As directed    Diagnosis:  NSTEMI Heart Failure (see criteria below if ordering Phase II)     Heart Failure Type: Chronic Systolic   After initial evaluation and assessments completed: Virtual Based Care may be provided alone or in conjunction with Phase 2 Cardiac Rehab based on patient barriers.: Yes   Diet - low sodium heart healthy   Complete by: As directed    Heart Failure patients record your daily weight using the same scale at the same time of day   Complete by: As directed    Increase activity slowly   Complete by: As directed      Follow-up Information    [facility]. Go on 09/24/2019.   Why: at 2:30pm Contact information: [address] [place] [address]       [facility] Follow up on 08/21/2019.   Specialty: Cardiology Why: at 1000 Contact information: [address] [id number] [place] Bonito [place] [address] [phone]            Duration of Discharge Encounter: Greater than 35 minutes   Signed, Tonye Becket NP-C  08/14/2019,  8:34 AM  Agree with above. Ok for d/c today See todays rounding note for further details.   Arvilla Meres, MD  3:54 PM

## 2019-08-14 NOTE — Plan of Care (Signed)
  Problem: Education: Goal: Knowledge of General Education information will improve Description: Including pain rating scale, medication(s)/side effects and non-pharmacologic comfort measures Outcome: Progressing   Problem: Health Behavior/Discharge Planning: Goal: Ability to manage health-related needs will improve Outcome: Progressing   Problem: Clinical Measurements: Goal: Will remain free from infection Outcome: Progressing   Problem: Nutrition: Goal: Adequate nutrition will be maintained Outcome: Progressing   Problem: Coping: Goal: Level of anxiety will decrease Outcome: Progressing   Problem: Pain Managment: Goal: General experience of comfort will improve Outcome: Progressing   Problem: Safety: Goal: Ability to remain free from injury will improve Outcome: Progressing   Elesa Hacker, RN

## 2019-08-15 NOTE — Telephone Encounter (Signed)
Advanced Heart Failure Patient Advocate Encounter   Patient was approved to receive Jardiance from [facility].  Effective dates: 08/15/2019 through 08/14/2020  Patient aware.   Audry Riles, PharmD, BCPS, BCCP, CPP Heart Failure Clinic Pharmacist [phone]

## 2019-08-21 ENCOUNTER — Encounter (HOSPITAL_COMMUNITY): Payer: Self-pay

## 2019-08-26 ENCOUNTER — Encounter (HOSPITAL_COMMUNITY): Payer: Self-pay

## 2019-08-26 ENCOUNTER — Other Ambulatory Visit: Payer: Self-pay

## 2019-08-26 ENCOUNTER — Ambulatory Visit (HOSPITAL_COMMUNITY)
Admission: RE | Admit: 2019-08-26 | Discharge: 2019-08-26 | Disposition: A | Discharge status: Home / Self Care | Payer: Self-pay | Source: Ambulatory Visit | Attending: Cardiology | Admitting: Cardiology

## 2019-08-26 VITALS — BP 148/76 | HR 101 | Wt 290.6 lb

## 2019-08-26 DIAGNOSIS — Z7901 Long term (current) use of anticoagulants: Secondary | ICD-10-CM | POA: Insufficient documentation

## 2019-08-26 DIAGNOSIS — Z7982 Long term (current) use of aspirin: Secondary | ICD-10-CM | POA: Insufficient documentation

## 2019-08-26 DIAGNOSIS — R9431 Abnormal electrocardiogram [ECG] [EKG]: Secondary | ICD-10-CM | POA: Insufficient documentation

## 2019-08-26 DIAGNOSIS — I10 Essential (primary) hypertension: Secondary | ICD-10-CM

## 2019-08-26 DIAGNOSIS — E785 Hyperlipidemia, unspecified: Secondary | ICD-10-CM | POA: Insufficient documentation

## 2019-08-26 DIAGNOSIS — I5022 Chronic systolic (congestive) heart failure: Secondary | ICD-10-CM | POA: Insufficient documentation

## 2019-08-26 DIAGNOSIS — Z6838 Body mass index (BMI) 38.0-38.9, adult: Secondary | ICD-10-CM | POA: Insufficient documentation

## 2019-08-26 DIAGNOSIS — R000 Tachycardia, unspecified: Secondary | ICD-10-CM | POA: Insufficient documentation

## 2019-08-26 DIAGNOSIS — E1165 Type 2 diabetes mellitus with hyperglycemia: Secondary | ICD-10-CM | POA: Insufficient documentation

## 2019-08-26 DIAGNOSIS — I255 Ischemic cardiomyopathy: Secondary | ICD-10-CM | POA: Insufficient documentation

## 2019-08-26 DIAGNOSIS — Z7984 Long term (current) use of oral hypoglycemic drugs: Secondary | ICD-10-CM | POA: Insufficient documentation

## 2019-08-26 DIAGNOSIS — I214 Non-ST elevation (NSTEMI) myocardial infarction: Secondary | ICD-10-CM | POA: Insufficient documentation

## 2019-08-26 DIAGNOSIS — Z87891 Personal history of nicotine dependence: Secondary | ICD-10-CM | POA: Insufficient documentation

## 2019-08-26 DIAGNOSIS — Z79899 Other long term (current) drug therapy: Secondary | ICD-10-CM | POA: Insufficient documentation

## 2019-08-26 DIAGNOSIS — I110 Hypertensive heart disease with heart failure: Secondary | ICD-10-CM | POA: Insufficient documentation

## 2019-08-26 DIAGNOSIS — I2510 Atherosclerotic heart disease of native coronary artery without angina pectoris: Secondary | ICD-10-CM | POA: Insufficient documentation

## 2019-08-26 LAB — BASIC METABOLIC PANEL
Anion gap: 14 (ref 5–15)
BUN: 30 mg/dL — ABNORMAL HIGH (ref 6–20)
CO2: 23 mmol/L (ref 22–32)
Calcium: 9.7 mg/dL (ref 8.9–10.3)
Chloride: 95 mmol/L — ABNORMAL LOW (ref 98–111)
Creatinine, Ser: 1.83 mg/dL — ABNORMAL HIGH (ref 0.61–1.24)
GFR calc Af Amer: 47 mL/min — ABNORMAL LOW (ref >60)
GFR calc non Af Amer: 40 mL/min — ABNORMAL LOW (ref >60)
Glucose, Bld: 174 mg/dL — ABNORMAL HIGH (ref 70–99)
Potassium: 4.4 mmol/L (ref 3.5–5.1)
Sodium: 132 mmol/L — ABNORMAL LOW (ref 135–145)

## 2019-08-26 LAB — CBC
HCT: 51.6 % (ref 39.0–52.0)
Hemoglobin: 17.5 g/dL — ABNORMAL HIGH (ref 13.0–17.0)
MCH: 29.6 pg (ref 26.0–34.0)
MCHC: 33.9 g/dL (ref 30.0–36.0)
MCV: 87.2 fL (ref 80.0–100.0)
Platelets: 280 10*3/uL (ref 150–400)
RBC: 5.92 MIL/uL — ABNORMAL HIGH (ref 4.22–5.81)
RDW: 12.4 % (ref 11.5–15.5)
WBC: 11.1 10*3/uL — ABNORMAL HIGH (ref 4.0–10.5)
nRBC: 0 % (ref 0.0–0.2)

## 2019-08-26 LAB — BRAIN NATRIURETIC PEPTIDE: B Natriuretic Peptide: 134.9 pg/mL — ABNORMAL HIGH (ref 0.0–100.0)

## 2019-08-26 MED ORDER — METOPROLOL SUCCINATE ER 25 MG PO TB24: 25.0000 mg | ORAL_TABLET | Freq: Every day | ORAL | 6 refills | Status: DC

## 2019-08-26 NOTE — Progress Notes (Signed)
PCP:  Primary Cardiologist: Dr Clifton JamesMcAlhany HF MD: Dr Gala RomneyBensimhon  CT Surgery: Dr Maren BeachVantrigt  HPI: Michael [name] is a 58 y/o morbidly obese maleadmitted for NSTEMI and found to have severe multivessel CAD w/ severe LV dysfunction (EF ~20% w/ probable small apical Thrombus no echo), poorly controlled DM and HLD.  Admitted 08/08/19 with increased shortness of breath and chest pain. HS Trop [id number]. Also had hgb A1C 10.2.  ECHO showed EF 25-30% and elevated LVEDP.  Started on heparin drip and taken for LHC that showed severe 2 vessel disease and elevated LVEDP. CMRI showed LVEF 19%, 50% transmural late gadolinium enhancement suggesting nonviability in the basal to apical inferior walls.  CT consulted for possible CABG. He was not thought to be a candidate for CABG at this time due to uncontrolled diabetes. Diuresed with IV lasix and transitioned to lasix 40 mg daily. HF meds initiated. He was intolerant losartan due some dizziness.  Referred to family medicine for assistance diabetes. Hgb A1C 10.2. On insulin in the hospital but switched to jardiance and metformin at discharge. Needs to get under control prior to CABG. He was given all medications prior to discharge.   Today he returns for post hospital HF follow up.Overall feeling fine. Denies SOB/PND/Orthopnea. No chest pain.  Appetite ok. No fever or chills. Weight at home has been going down 2-3 pounds per day. Weight at home today 279.9  pounds. Taking all medications. Has limited income. Trying to obtain disability. Lives alone.   Cardiac Testing  08/09/19 - ECHO showed EF 25-30% and elevated LVEDP.  08/08/19 -LHC that showed severe 2 vessel disease and elevated LVEDP. CMRI showed LVEF 19%, 50% transmural late gadolinium enhancement suggesting nonviability in the basal to apical inferior walls.  ROS: All systems negative except as listed in HPI, PMH and Problem List.  SH:  Social History   Socioeconomic History  . Marital status: Single     Spouse name: Not on file  . Number of children: Not on file  . Years of education: Not on file  . Highest education level: Not on file  Occupational History  . Not on file  Tobacco Use  . Smoking status: Never Smoker  . Smokeless tobacco: Current User    Types: Chew  . Tobacco comment: not since 1987   Substance and Sexual Activity  . Alcohol use: Never  . Drug use: Never  . Sexual activity: Not Currently  Other Topics Concern  . Not on file  Social History Narrative   Lives alone with a cat and a dog.  Has family support from brother, aunt, uncle and cousins.    Social Determinants of Health   Financial Resource Strain: Low Risk   . Difficulty of Paying Living Expenses: Not very hard  Food Insecurity: Unknown  . Worried About Programme researcher, broadcasting/film/videounning Out of Food in the Last Year: Patient refused  . Ran Out of Food in the Last Year: Patient refused  Transportation Needs: Unknown  . Lack of Transportation (Medical): Patient refused  . Lack of Transportation (Non-Medical): Patient refused  Physical Activity: Unknown  . Days of Exercise per Week: Patient refused  . Minutes of Exercise per Session: Patient refused  Stress: Stress Concern Present  . Feeling of Stress : To some extent  Social Connections: Unknown  . Frequency of Communication with Friends and Family: Patient refused  . Frequency of Social Gatherings with Friends and Family: Patient refused  . Attends Religious Services: Patient refused  . Active Member of  Clubs or Organizations: Patient refused  . Attends Archivist Meetings: Patient refused  . Marital Status: Patient refused  Intimate Partner Violence: Unknown  . Fear of Current or Ex-Partner: Patient refused  . Emotionally Abused: Patient refused  . Physically Abused: Patient refused  . Sexually Abused: Patient refused    FH: No family history on file.  Past Medical History:  Diagnosis Date  . CAD in native artery    a. multivessel by cath 07/2019 - felt to  need CABG but needs improvement of diabetes + CHF first.  . Chronic systolic CHF (congestive heart failure) ([place])    a. dx 07/2019.  Marland Kitchen Dyspnea   . Essential hypertension   . Hyperlipidemia   . Ischemic cardiomyopathy   . LV (left ventricular) mural thrombus    a. dx 07/2019.  . Morbid obesity ([place])   . Tobacco use   . Uncontrolled diabetes mellitus ([place])     Current Outpatient Medications  Medication Sig Dispense Refill  . apixaban (ELIQUIS) 5 MG TABS tablet Take 1 tablet (5 mg total) by mouth 2 (two) times daily. 60 tablet 0  . aspirin EC 81 MG EC tablet Take 1 tablet (81 mg total) by mouth daily. 30 tablet 6  . atorvastatin (LIPITOR) 80 MG tablet Take 1 tablet (80 mg total) by mouth daily at 6 PM. 30 tablet 6  . digoxin (LANOXIN) 0.125 MG tablet Take 1 tablet (0.125 mg total) by mouth daily. 30 tablet 6  . empagliflozin (JARDIANCE) 10 MG TABS tablet Take 10 mg by mouth daily before breakfast. 30 tablet 6  . furosemide (LASIX) 40 MG tablet Take 1 tablet (40 mg total) by mouth daily. 30 tablet 6  . isosorbide mononitrate (IMDUR) 30 MG 24 hr tablet Take 0.5 tablets (15 mg total) by mouth daily. 30 tablet 6  . metFORMIN (GLUCOPHAGE) 1000 MG tablet Take 1 tablet (1,000 mg total) by mouth daily with breakfast. 60 tablet 11  . metoprolol succinate (TOPROL-XL) 25 MG 24 hr tablet Take 1 tablet (25 mg total) by mouth daily. 30 tablet 6  . nitroGLYCERIN (NITROSTAT) 0.4 MG SL tablet Place 1 tablet (0.4 mg total) under the tongue every 5 (five) minutes x 3 doses as needed for chest pain. 25 tablet 12  . spironolactone (ALDACTONE) 25 MG tablet Take 1 tablet (25 mg total) by mouth daily. 30 tablet 6   No current facility-administered medications for this encounter.    Vitals:   08/26/19 1341  BP: (!) 148/76  Pulse: (!) 101  SpO2: 98%  Weight: 131.8 kg (290 lb 9.6 oz)   Wt Readings from Last 3 Encounters:  08/26/19 131.8 kg (290 lb 9.6 oz)  08/14/19 (!) 136.8 kg (301 lb 9.6 oz)     PHYSICAL EXAM:  General:  Well appearing. No resp difficulty HEENT: normal Neck: supple. JVP flat. Carotids 2+ bilaterally; no bruits. No lymphadenopathy or thryomegaly appreciated. Cor: PMI normal. Regular rate & rhythm. No rubs, gallops or murmurs. Lungs: clear Abdomen: soft, nontender, nondistended. No hepatosplenomegaly. No bruits or masses. Good bowel sounds. Extremities: no cyanosis, clubbing, rash, edema Neuro: alert & orientedx3, cranial nerves grossly intact. Moves all 4 extremities w/o difficulty. Affect pleasant.   ECG: Sinus Tach 103 bpm   ASSESSMENT & PLAN: 1. CAD: admitted for NSTEMI. Troponin peaking > 5K. LHC w/ multivessel CAD noted above. EF severely reduced by echo and cMRI (19%). cMRI w/greater than 50% nonviable myocardium in the inferior wall, apical anterior wall, and the apical-lateral  wall. The only area of viability was in the mid anterior-lateral wall corresponding to the patent ramus branch of the left coronary.Seen by Dr. Donata Clay and not felt to be great candidate for CABG at this time. Would be high risk for poor surgical outcome given poorly controlled DM. Needs better control of DM and HF optimization prior to being reconsidered for CABG.  - No chest pain.  - Continue ASA, high potency statin,?blocker.  Stop imdur 09/13/18. Continue SL Nitro as needed.   - Would benefit from cardiac rehab but has not insurance.   2. Chronic Systolic HF/ Ischemic CM: EF~20%.  - Volume status stable. Continue lasix 40 mg daily.  - Increase Toprol XL 25 mg twice a day.  -Continue spiro 25 mg daily - Imdur 15 mg. Stop 09/13/18.  - Continue digoxin 0.125 mg daily - Intolerant losartan during recent hospitalization due to dizziness. Anticipate re-challenging. - Check BMET today.  - Continue jardiance 10 mg daily  - We discussed daily weight and low salt food choices.   3. DM: hgb A1c 10.2. Goal <7.0  -Continue metformin 1000 mg twice a day.  - Continue   jardiance 10 mg daily  - Can add insulin as an outpatient. He has follow up with PCP next month.    4. HLD:LDL 210 mg/dL. Goal LDL <70 - high intensity statin started, atorvastatin 80 mg nightly - if not at target goal, consider addition of Zetia or PCSK9i   5. Obesity:Body mass index is 38.34 kg/m.  6. HTN: Elevated. Increasing BB as noted above.   7. Probable small apical thrombus: noted on echo. CMRI was carefully reviewed by Dr. Jerene Pitch and Dr Gala Romney. Not felt to have thrombus but with HF/CAD will place on eliquis 5 mg twice a day for 30 days then stop. He will stop 09/13/2018   No insurance. HFSW referral completed.  Nees to try and obtain Medicaid.   Follow up in 4 weeks. He declined follow up in 2 weeks.  He was instructed to bring all medications to his follow up appointment .     Michael Parfitt NP-C  3:53 PM

## 2019-08-26 NOTE — Addendum Note (Signed)
Encounter addended by: Jorge Ny, LCSW on: 08/26/2019 5:10 PM  Actions taken: Clinical Note Signed

## 2019-08-26 NOTE — Patient Instructions (Addendum)
STOP Eliquis when your bottle is empty (09/14/2019) STOP Imdur when your bottle is empty (09/14/2019) INCREASE Metoprolol to 25 mg, one tab twice daily   Labs today We will only contact you if something comes back abnormal or we need to make some changes. Otherwise no news is good news!  Your physician recommends that you schedule a follow-up appointment in: 4 weeks  in the Advanced Practitioners (PA/NP) Clinic   and 8 weeks with Dr Haroldine Laws  Do the following things EVERYDAY: 1) Weigh yourself in the morning before breakfast. Write it down and keep it in a log. 2) Take your medicines as prescribed 3) Eat low salt foods--Limit salt (sodium) to 2000 mg per day.  4) Stay as active as you can everyday 5) Limit all fluids for the day to less than 2 liters   At the [facility], you and your health needs are our priority. As part of our continuing mission to provide you with exceptional heart care, we have created designated Provider Care Teams. These Care Teams include your primary Cardiologist (physician) and Advanced Practice Providers (APPs- Physician Assistants and Nurse Practitioners) who all work together to provide you with the care you need, when you need it.   You may see any of the following providers on your designated Care Team at your next follow up: Marland Kitchen Dr Glori Bickers . Dr Loralie Champagne . Darrick Grinder, NP . Lyda Jester, PA . Audry Riles, PharmD   Please be sure to bring in all your medications bottles to every appointment.

## 2019-08-26 NOTE — Progress Notes (Signed)
CSW consulted to speak with pt regarding financial concerns.  Pt reports he lives alone and struggles paying bills as he hasn't worked in 30 years.  Reports never having applied for disability because he spoke to someone and was told he wouldn't qualify.  Currently pt lives off of $400/month from his brother and has found ways to make it work.  Pt reports he can't work due to medical condition so CSW explained that applying to disability would be only way for him to get source of income.  Pt states he can't find [place] SSA number and when he has attempted to call in the past has been unable to get a hold of someone.  CSW called [place] [employer] with pt and set up appt for pt to apply for SSI over the phone- set up for December 22nd at 11:30am- provided information to patient as well as number for SSA.  Pt also reports having starting application for Food Stamps.  CSW will continue to follow and assist as needed  Michael [name], [facility] Desk#: [phone] Cell#: [phone]

## 2019-08-27 ENCOUNTER — Telehealth (HOSPITAL_COMMUNITY): Payer: Self-pay | Admitting: Cardiology

## 2019-08-27 DIAGNOSIS — I5022 Chronic systolic (congestive) heart failure: Secondary | ICD-10-CM

## 2019-08-27 NOTE — Telephone Encounter (Signed)
Kerry Dory, [place]  08/27/2019 12:02 PM EST    Pt aware and voiced understanding repeat labs 12/23   Conrad [place], NP  08/26/2019 4:44 PM EST    Renal function elevated. Please call. Stop spironolactone and lasix. Repeat BMET next week

## 2019-09-03 ENCOUNTER — Ambulatory Visit (HOSPITAL_COMMUNITY)
Admission: RE | Admit: 2019-09-03 | Discharge: 2019-09-03 | Disposition: A | Discharge status: Home / Self Care | Payer: Self-pay | Source: Ambulatory Visit | Attending: Internal Medicine | Admitting: Internal Medicine

## 2019-09-03 ENCOUNTER — Other Ambulatory Visit: Payer: Self-pay

## 2019-09-03 DIAGNOSIS — I5022 Chronic systolic (congestive) heart failure: Secondary | ICD-10-CM | POA: Insufficient documentation

## 2019-09-03 LAB — BASIC METABOLIC PANEL
Anion gap: 8 (ref 5–15)
BUN: 19 mg/dL (ref 6–20)
CO2: 26 mmol/L (ref 22–32)
Calcium: 9.2 mg/dL (ref 8.9–10.3)
Chloride: 104 mmol/L (ref 98–111)
Creatinine, Ser: 1.51 mg/dL — ABNORMAL HIGH (ref 0.61–1.24)
GFR calc Af Amer: 59 mL/min — ABNORMAL LOW (ref >60)
GFR calc non Af Amer: 51 mL/min — ABNORMAL LOW (ref >60)
Glucose, Bld: 139 mg/dL — ABNORMAL HIGH (ref 70–99)
Potassium: 4.4 mmol/L (ref 3.5–5.1)
Sodium: 138 mmol/L (ref 135–145)

## 2019-09-11 MED FILL — metFORMIN HCL 1000 MG TABS: 1000 | 30 days supply | Qty: 30 | Fill #0

## 2019-09-11 MED FILL — DIGOXIN 0.125 MG TABLET: 125 | 30 days supply | Qty: 30 | Fill #0

## 2019-09-11 MED FILL — METOPROLOL SUCCINATE ER 25: 25 | 30 days supply | Qty: 30 | Fill #0

## 2019-09-11 MED FILL — ATORVASTATIN 80 MG TABLET: 80 | 30 days supply | Qty: 30 | Fill #0

## 2019-09-11 MED FILL — ISOSORBIDE MN ER 30 MG TAB: 30 | 30 days supply | Qty: 15 | Fill #0

## 2019-09-22 ENCOUNTER — Telehealth (HOSPITAL_COMMUNITY): Payer: Self-pay | Admitting: Cardiology

## 2019-09-22 NOTE — Telephone Encounter (Signed)
COVID-19 pre-appointment screening questions:   Do you have a history of COVID-19 or a positive test result in the past 7-10 days? NO  To the best of your knowledge, have you been in close contact with anyone with a confirmed diagnosis of COVID 19?  NO  Have you had any one or more of the following: Fever, chills, cough, shortness of breath (out of the normal for you) or any flu-like symptoms? NO  Are you experiencing any of the following symptoms that is new or out of usual for you:  . Ear, nose or throat discomfort . Sore throat . Headache . Muscle Pain . Diarrhea . Loss of taste or smell  YES- Throat discomfort, nose discomfort,muscle pains, loss of taste -patient report he has chronic throat and nose discomfort due to sinus issues, muscle pains due to lifting weight for over 20 years, and lost of taste due to diet being changed /restricted. Advised above dont seem to be related to COVID however will forward to provider/clinic lead and we will be in touch regarding symptoms. Office may need to switch to virtual. Patient became very upset and stated "you people need to get very specific about these questions. im getting tired of all these COVID questions, as a matter of fact cancel my appointment right now". Again advised we are screening and above questions are screening for the safety of everyone. Patient said cancel his appt.  Note to provider as FYI   Reviewed all the following with patient: . Use of hand sanitizer when entering the building . Everyone is required to wear a mask in the building, if you do not have a mask we are happy to provide you with one when you arrive . NO Visitor guidelines   If patient answers YES to any of questions they must change to a virtual visit and place note in comments about symptoms

## 2019-09-23 ENCOUNTER — Encounter (HOSPITAL_COMMUNITY): Payer: Self-pay

## 2019-09-23 NOTE — Telephone Encounter (Signed)
Pt has f/u w/Dr bensimhon in Feb

## 2019-09-24 ENCOUNTER — Ambulatory Visit (INDEPENDENT_AMBULATORY_CARE_PROVIDER_SITE_OTHER): Payer: Self-pay | Admitting: Nurse Practitioner

## 2019-09-24 DIAGNOSIS — Z7689 Persons encountering health services in other specified circumstances: Secondary | ICD-10-CM

## 2019-09-24 DIAGNOSIS — I10 Essential (primary) hypertension: Secondary | ICD-10-CM

## 2019-09-24 DIAGNOSIS — E1165 Type 2 diabetes mellitus with hyperglycemia: Secondary | ICD-10-CM

## 2019-09-24 DIAGNOSIS — E785 Hyperlipidemia, unspecified: Secondary | ICD-10-CM

## 2019-09-24 MED ORDER — RELION BLOOD GLUCOSE TEST VI STRP: ORAL_STRIP | 12 refills | Status: DC

## 2019-09-24 NOTE — Progress Notes (Signed)
Virtual Visit via Telephone Note Due to national recommendations of social distancing due to COVID 19, telehealth visit is felt to be most appropriate for this patient at this time.  I discussed the limitations, risks, security and privacy concerns of performing an evaluation and management service by telephone and the availability of in person appointments. I also discussed with the patient that there may be a patient responsible charge related to this service. The patient expressed understanding and agreed to proceed.    I connected with Michael [name] on 09/24/19  at   2:30 PM EST  EDT by telephone and verified that I am speaking with the correct person using two identifiers.   Consent I discussed the limitations, risks, security and privacy concerns of performing an evaluation and management service by telephone and the availability of in person appointments. I also discussed with the patient that there may be a patient responsible charge related to this service. The patient expressed understanding and agreed to proceed.   Location of Patient: Private Residence   Location of Provider: [facility]    Persons participating in Telemedicine visit: Bertram Denver FNP-BC YY Marion CMA Channing Mutters [name]    History of Present Illness: Telemedicine visit for: Establish Care  has a past medical history of CAD in native artery, Chronic systolic CHF (congestive heart failure) (HCC), Dyspnea, Essential hypertension, Hyperlipidemia, Ischemic cardiomyopathy, LV (left ventricular) mural thrombus, Morbid obesity (HCC), Tobacco use, and Uncontrolled diabetes mellitus (HCC).   Mr Michael [name] spent 80% of the telehealth visit talking about his dissatisfaction with the COVID screening questions he is asked before all of his visits with the specialist and primary care as well as issues he had with finding the [facility], all the bills he is receiving and how he is being given the run around  about his [facility]. He states "when I'm mad everyone just needs to shut up and listen". I gave him the number to patient experience to discuss his grievances. States he canceled one of his appointments yesterday due to the COVID questions that "pissed" him off.   Admitted 08-08-2019 through 08-14-2019 with NSTEMI/Acute systolic CHF,  ICM and newly diagnosed diabetes mellitus type 2. CT consulted for possible CABG. He was not thought to be a candidate for CABG at this time due to uncontrolled   Transitioned to PO furosemide 40 mg daily upon discharge. Started on metformin and Jardiance for DM however today he states he was taken off furosemide, metformin and spirinolactone due to his "kidneys".  Lab Results  Component Value Date   CREATININE 1.51 (H) 09/03/2019     DM TYPE 2 Monitoring blood glucose levels TID with fasting 110-120s.  Postprandial 150s. Taking Jardiance 10 mg daily. Will need meter check in a few weeks as he may need to also start victoza or trulicity. I will need to correlate his reported readings with actual glucometer readings prior to initiating another agent.  High intensity statin 80 mg daily. Had dizziness with ARB in hospital. LDL not at goal of <70.  Lab Results  Component Value Date   HGBA1C 10.2 (H) 08/08/2019   Lab Results  Component Value Date   LDLCALC 210 (H) 08/08/2019      Past Medical History:  Diagnosis Date  . CAD in native artery    a. multivessel by cath 07/2019 - felt to need CABG but needs improvement of diabetes + CHF first.  . Chronic systolic CHF (congestive heart failure) (HCC)  a. dx 07/2019.  Marland Kitchen Dyspnea   . Essential hypertension   . Hyperlipidemia   . Ischemic cardiomyopathy   . LV (left ventricular) mural thrombus    a. dx 07/2019.  . Morbid obesity (HCC)   . Tobacco use   . Uncontrolled diabetes mellitus (HCC)     Past Surgical History:  Procedure Laterality Date  . LEFT HEART CATH AND CORONARY ANGIOGRAPHY N/A 08/08/2019    Procedure: LEFT HEART CATH AND CORONARY ANGIOGRAPHY;  Surgeon: Lyn Records, MD;  Location: [facility];  Service: Cardiovascular;  Laterality: N/A;  . NO PAST SURGERIES      No family history on file.  Social History   Socioeconomic History  . Marital status: Single    Spouse name: Not on file  . Number of children: Not on file  . Years of education: Not on file  . Highest education level: Not on file  Occupational History  . Not on file  Tobacco Use  . Smoking status: Never Smoker  . Smokeless tobacco: Current User    Types: Chew  . Tobacco comment: not since 1987   Substance and Sexual Activity  . Alcohol use: Never  . Drug use: Never  . Sexual activity: Not Currently  Other Topics Concern  . Not on file  Social History Narrative   Lives alone with a cat and a dog.  Has family support from brother, aunt, uncle and cousins.    Social Determinants of Health   Financial Resource Strain: Low Risk   . Difficulty of Paying Living Expenses: Not very hard  Food Insecurity: Unknown  . Worried About Programme researcher, broadcasting/film/video in the Last Year: Patient refused  . Ran Out of Food in the Last Year: Patient refused  Transportation Needs: Unknown  . Lack of Transportation (Medical): Patient refused  . Lack of Transportation (Non-Medical): Patient refused  Physical Activity: Unknown  . Days of Exercise per Week: Patient refused  . Minutes of Exercise per Session: Patient refused  Stress: Stress Concern Present  . Feeling of Stress : To some extent  Social Connections: Unknown  . Frequency of Communication with Friends and Family: Patient refused  . Frequency of Social Gatherings with Friends and Family: Patient refused  . Attends Religious Services: Patient refused  . Active Member of Clubs or Organizations: Patient refused  . Attends Banker Meetings: Patient refused  . Marital Status: Patient refused     Observations/Objective: Awake, alert and oriented x  3   Review of Systems  Constitutional: Negative for fever, malaise/fatigue and weight loss.  HENT: Negative.  Negative for nosebleeds.   Eyes: Negative.  Negative for blurred vision, double vision and photophobia.  Respiratory: Negative.  Negative for cough and shortness of breath.   Cardiovascular: Negative.  Negative for chest pain, palpitations and leg swelling.  Gastrointestinal: Negative.  Negative for heartburn, nausea and vomiting.  Musculoskeletal: Negative.  Negative for myalgias.  Neurological: Negative.  Negative for dizziness, focal weakness, seizures and headaches.  Psychiatric/Behavioral: Negative.  Negative for suicidal ideas.    Assessment and Plan: Halil was seen today for establish care, diabetes, hypertension and hyperlipidemia.  Diagnoses and all orders for this visit:  Encounter to establish care  Uncontrolled type 2 diabetes mellitus with hyperglycemia (HCC) -     Ambulatory referral to Ophthalmology -     glucose blood (RELION GLUCOSE TEST STRIPS) test strip; Check blood glucose level by fingerstick twice per day. ICD 10 E 11.65  Dyslipidemia, goal LDL below 70 INSTRUCTIONS: Work on a low fat, heart healthy diet and participate in regular aerobic exercise program by working out at least 150 minutes per week; 5 days a week-30 minutes per day. Avoid red meat/beef/steak,  fried foods. junk foods, sodas, sugary drinks, unhealthy snacking, alcohol and smoking.  Drink at least 80 oz of water per day and monitor your carbohydrate intake daily.   Essential hypertension Continue all antihypertensives as prescribed.  Remember to bring in your blood pressure log with you for your follow up appointment.  DASH/Mediterranean Diets are healthier choices for HTN.       Follow Up Instructions Return for 3 weeks METER CHECK then [facility] appointment in 4 weeks after that.     I discussed the assessment and treatment plan with the patient. The patient was provided an  opportunity to ask questions and all were answered. The patient agreed with the plan and demonstrated an understanding of the instructions.   The patient was advised to call back or seek an in-person evaluation if the symptoms worsen or if the condition fails to improve as anticipated.  I provided 20 minutes of non-face-to-face time during this encounter including median intraservice time, reviewing previous notes, labs, imaging, medications and explaining diagnosis and management.  Gildardo Pounds, FNP-BC

## 2019-10-01 ENCOUNTER — Telehealth: Payer: Self-pay | Admitting: Cardiothoracic Surgery

## 2019-10-01 ENCOUNTER — Ambulatory Visit: Payer: Self-pay | Admitting: Cardiothoracic Surgery

## 2019-10-02 ENCOUNTER — Telehealth: Payer: Self-pay | Admitting: Cardiothoracic Surgery

## 2019-10-02 ENCOUNTER — Other Ambulatory Visit: Payer: Self-pay

## 2019-10-02 NOTE — Telephone Encounter (Signed)
[address]       [place] [address]             [phone]     CARDIOTHORACIC SURGERY TELEPHONE VIRTUAL OFFICE NOTE  Referring Provider is No ref. provider found Primary Cardiologist is Lauree Chandler, MD PCP is Patient, No Pcp Per   HPI:  I spoke with Michael Martinique (DOB 01-Feb-1961 ) via telephone on 10/02/2019 at 2:07 PM and verified that I was speaking with the correct person using more than one form of identification.  We discussed the reason(s) for conducting our visit virtually instead of in-person.  The patient expressed understanding the circumstances and agreed to proceed as described.  The patient was initially seen late 2020 for ischemic dilated cardiomyopathy, significant two-vessel CAD, low EF with elevated LVEDP and significant scarring on MRI viability study.  He is a diabetic, has obesity, and has been sincerely trying to improve his diet, lose weight, take his medications as recommended and to walk 15 minutes twice a day.  Through the conversation over the phone today it sounds like his weight is improved now down to 280 pounds.  He has minimal symptoms of CHF and denies angina.  His blood sugars are checked 3 times a day and range110-180  He has not had an A1c since November.  At this point continue medical therapy and medical follow-up are the best options for the patient.  He also has several personal issues regarding the cost of extended hospital stay and extensive therapies while he is still trying to find insurance. Current Outpatient Medications  Medication Sig Dispense Refill  . apixaban (ELIQUIS) 5 MG TABS tablet Take 1 tablet (5 mg total) by mouth 2 (two) times daily. 60 tablet 0  . aspirin EC 81 MG EC tablet Take 1 tablet (81 mg total) by mouth daily. 30 tablet 6  . atorvastatin (LIPITOR) 80 MG tablet Take 1 tablet (80 mg total) by mouth daily at 6 PM. 30 tablet 6  . digoxin (LANOXIN) 0.125 MG tablet Take 1 tablet (0.125 mg total) by  mouth daily. 30 tablet 6  . empagliflozin (JARDIANCE) 10 MG TABS tablet Take 10 mg by mouth daily before breakfast. 30 tablet 6  . glucose blood (RELION GLUCOSE TEST STRIPS) test strip Check blood glucose level by fingerstick twice per day. ICD 10 E 11.65 200 each 12  . isosorbide mononitrate (IMDUR) 30 MG 24 hr tablet Take 0.5 tablets (15 mg total) by mouth daily. 30 tablet 6  . metFORMIN (GLUCOPHAGE) 1000 MG tablet Take 1 tablet (1,000 mg total) by mouth daily with breakfast. 60 tablet 11  . metoprolol succinate (TOPROL-XL) 25 MG 24 hr tablet Take 1 tablet (25 mg total) by mouth daily. 60 tablet 6  . nitroGLYCERIN (NITROSTAT) 0.4 MG SL tablet Place 1 tablet (0.4 mg total) under the tongue every 5 (five) minutes x 3 doses as needed for chest pain. (Patient not taking: Reported on 09/24/2019) 25 tablet 12   No current facility-administered medications for this visit.     Diagnostic Tests:  Most recent coronary angiogram personally reviewed showing significant two-vessel CAD and very poor LV function   Impression: Ischemic cardiomyopathy Chronic systolic heart failure Diabetes mellitus Obesity   Plan: Continue medical therapy.  I will be available to see the patient again if surgical revascularization becomes a realistic option.    I discussed limitations of evaluation and management via telephone.  The patient was advised to call back  for repeat telephone consultation or to seek an in-person evaluation if questions arise or the patient's clinical condition changes in any significant manner.  I spent in excess of 5 minutes of non-face-to-face time during the conduct of this telephone virtual office consultation.  Level 1  (99441)             5-10 minutes Level 2  (99442)            11-20 minutes Level 3  (99443)            21-30 minutes    10/02/2019 2:07 PM

## 2019-10-27 ENCOUNTER — Encounter (HOSPITAL_COMMUNITY): Payer: Self-pay | Admitting: Internal Medicine

## 2020-10-15 DIAGNOSIS — Z736 Limitation of activities due to disability: Secondary | ICD-10-CM

## 2021-08-23 IMAGING — DX DG CHEST 2V
3 series · 3 of 3 positions shown · non-contrast
Comparison: August 08, 2019

CLINICAL DATA: Shortness of breath

EXAM:
CHEST - 2 VIEW

[chest pa (1 of 2)]
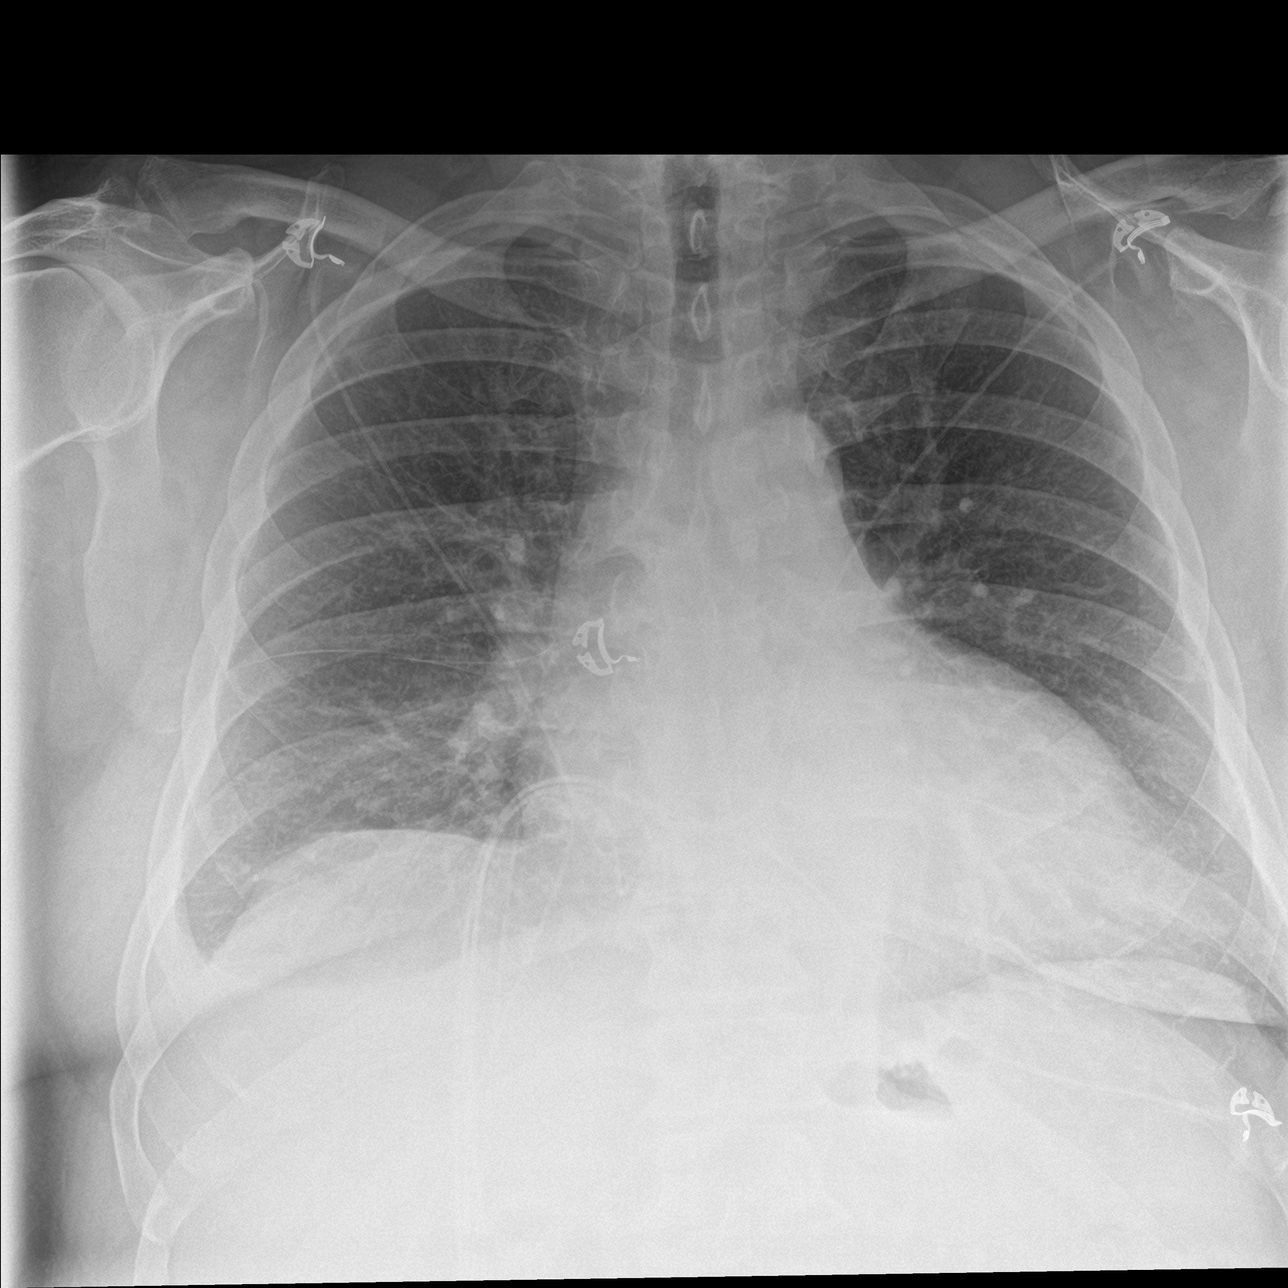

[chest lat]
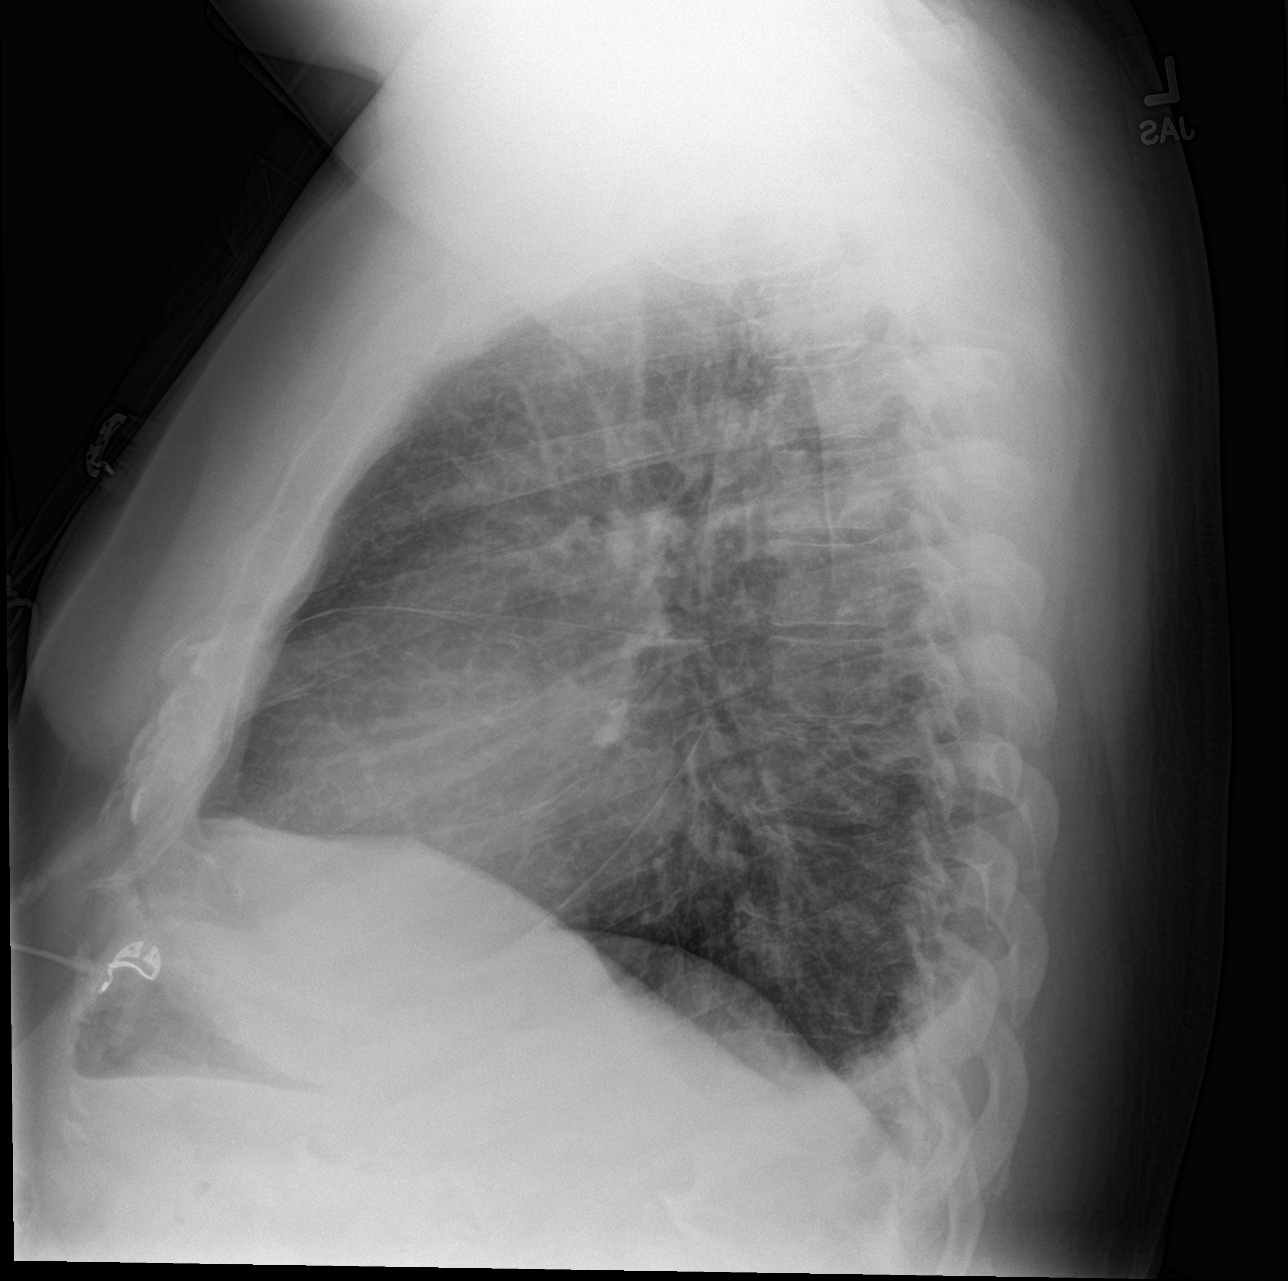

[chest pa (2 of 2)]
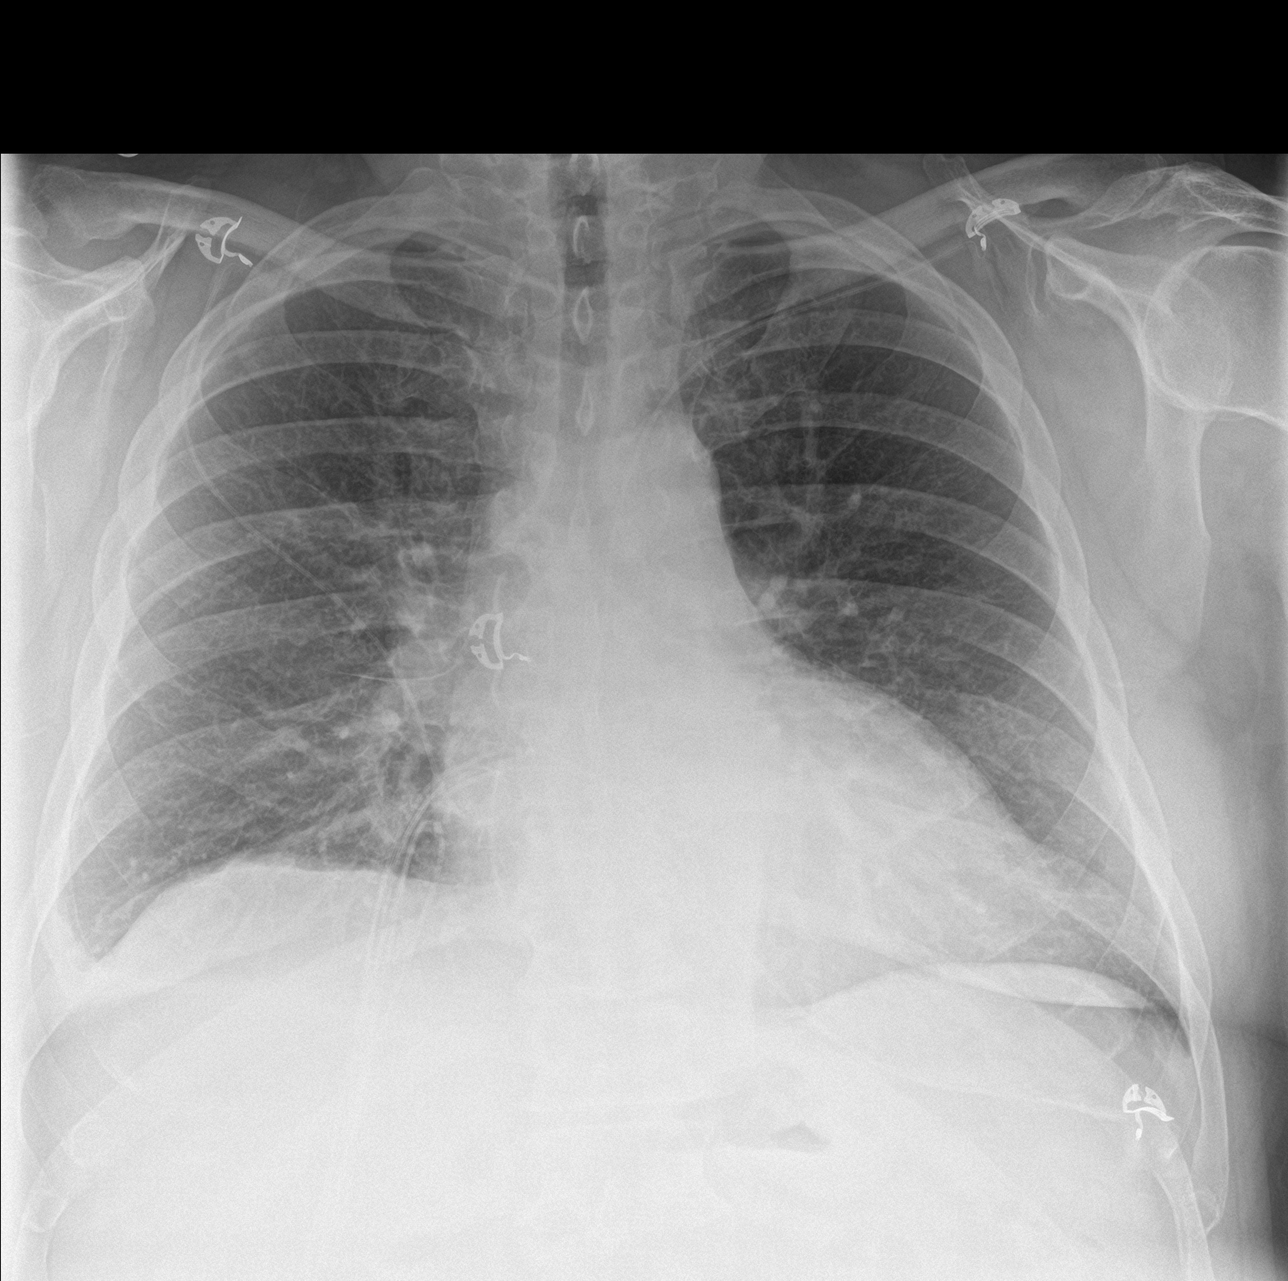

[3 of 3 positions shown; findings below may reference images not displayed]

FINDINGS: There is no edema or consolidation. Heart is enlarged with pulmonary
vascularity normal. No adenopathy. No bone lesions.
IMPRESSION: Cardiomegaly.  No edema or consolidation.  No adenopathy evident.

## 2022-08-01 ENCOUNTER — Encounter: Payer: Self-pay | Admitting: Cardiology

## 2022-08-09 ENCOUNTER — Ambulatory Visit: Payer: Medicaid Other | Attending: Cardiology | Admitting: Cardiology

## 2022-08-09 ENCOUNTER — Encounter: Payer: Self-pay | Admitting: Cardiology

## 2022-08-09 VITALS — BP 130/100 | HR 96 | Ht 72.0 in | Wt 301.4 lb

## 2022-08-09 DIAGNOSIS — I2510 Atherosclerotic heart disease of native coronary artery without angina pectoris: Secondary | ICD-10-CM

## 2022-08-09 DIAGNOSIS — I5021 Acute systolic (congestive) heart failure: Secondary | ICD-10-CM

## 2022-08-09 DIAGNOSIS — E782 Mixed hyperlipidemia: Secondary | ICD-10-CM

## 2022-08-09 DIAGNOSIS — I10 Essential (primary) hypertension: Secondary | ICD-10-CM

## 2022-08-09 DIAGNOSIS — R0609 Other forms of dyspnea: Secondary | ICD-10-CM

## 2022-08-09 DIAGNOSIS — E119 Type 2 diabetes mellitus without complications: Secondary | ICD-10-CM

## 2022-08-09 DIAGNOSIS — I255 Ischemic cardiomyopathy: Secondary | ICD-10-CM

## 2022-08-09 NOTE — Patient Instructions (Signed)
Medication Instructions:  Your physician recommends that you continue on your current medications as directed. Please refer to the Current Medication list given to you today.  *If you need a refill on your cardiac medications before your next appointment, please call your pharmacy*   Lab Work: CMP, A1C, Direct LDL- today If you have labs (blood work) drawn today and your tests are completely normal, you will receive your results only by: MyChart Message (if you have MyChart) OR A paper copy in the mail If you have any lab test that is abnormal or we need to change your treatment, we will call you to review the results.   Testing/Procedures: Your physician has requested that you have an echocardiogram. Echocardiography is a painless test that uses sound waves to create images of your heart. It provides your doctor with information about the size and shape of your heart and how well your heart's chambers and valves are working. This procedure takes approximately one hour. There are no restrictions for this procedure. Please do NOT wear cologne, perfume, aftershave, or lotions (deodorant is allowed). Please arrive 15 minutes prior to your appointment time.    Follow-Up: At [facility], you and your health needs are our priority.  As part of our continuing mission to provide you with exceptional heart care, we have created designated Provider Care Teams.  These Care Teams include your primary Cardiologist (physician) and Advanced Practice Providers (APPs -  Physician Assistants and Nurse Practitioners) who all work together to provide you with the care you need, when you need it.  We recommend signing up for the patient portal called "MyChart".  Sign up information is provided on this After Visit Summary.  MyChart is used to connect with patients for Virtual Visits (Telemedicine).  Patients are able to view lab/test results, encounter notes, upcoming appointments, etc.  Non-urgent messages can  be sent to your provider as well.   To learn more about what you can do with MyChart, go to [email or web address].    Your next appointment:   3 month(s)  The format for your next appointment:   In Person  Provider:   Gypsy Balsam, MD    Other Instructions NA

## 2022-08-09 NOTE — Progress Notes (Unsigned)
Cardiology Consultation:    Date:  08/09/2022   ID:  Michael [name], DOB 1961/03/03, MRN [id number]  PCP:  Patient, No Pcp Per  Cardiologist:  Gypsy Balsam, MD   Referring MD: Shelle Iron, MD   Chief Complaint  Patient presents with   Heart Eval    Possible CHF per Dr. Laymond Purser   Shortness of Breath        Leg Swelling    On Lasix -helps    History of Present Illness:    Michael [name] is a 61 y.o. male who is being seen today for the evaluation of congestive heart failure at the request of Sistasis, Rosanne Ashing, MD. past medical history significant for ischemic cardiomyopathy initially detected in 2020 when he was find to have ejection fraction of 15 to 20% cardiac catheterization has been performed and he was found to have significant two-vessel coronary artery disease at that time he had elevated LVEDP he also got viability study performed by cardiac MRI which showed ejection fraction of 19% 50% transmural late gadolinium enhancement suggesting nonviability in the basal to apical inferior walls, at that time he had CT consultation done for possible CABG, however because of uncontrolled diabetes and also some financial issue surgery has not been performed.  He was reconsulted here later and the same conclusion has been reached.  That he was poor candidate for intervention.  He was referred to [place] by primary care physician with diagnosis of congestive heart failure. Patient is quite interesting individual.  I am afraid he is ignoring to his condition he done he is doing great and he always worked very hard and have no difficulty doing it.  He said he is able to cut grass with some shortness of breath and fatigue but otherwise doing well he tells me that he does not have any chest pain tightness squeezing pressure burning chest no palpitation dizziness.  He does have however swelling of lower extremities which gets worse at evening time.  Past Medical History:  Diagnosis Date   CAD in native  artery    a. multivessel by cath 07/2019 - felt to need CABG but needs improvement of diabetes + CHF first.   Chronic systolic CHF (congestive heart failure) (HCC)    a. dx 07/2019.   Dyspnea    Essential hypertension    Hypercholesterolemia    Hyperlipidemia    Ischemic cardiomyopathy    LV (left ventricular) mural thrombus    a. dx 07/2019.   Morbid obesity (HCC)    Myocardial infarction (HCC)    Tobacco use    Uncontrolled diabetes mellitus     Past Surgical History:  Procedure Laterality Date   LEFT HEART CATH AND CORONARY ANGIOGRAPHY N/A 08/08/2019   Procedure: LEFT HEART CATH AND CORONARY ANGIOGRAPHY;  Surgeon: Lyn Records, MD;  Location: [facility];  Service: Cardiovascular;  Laterality: N/A;    Current Medications: Current Meds  Medication Sig   aspirin EC 81 MG EC tablet Take 1 tablet (81 mg total) by mouth daily.   furosemide (LASIX) 40 MG tablet Take 40 mg by mouth 2 (two) times daily.   loratadine (CLARITIN) 10 MG tablet Take 10 mg by mouth daily as needed for allergies.   [DISCONTINUED] benzonatate (TESSALON) 100 MG capsule Take 100 mg by mouth 3 (three) times daily as needed for cough.     Allergies:   Patient has no known allergies.   Social History   Socioeconomic History   Marital status:  Single    Spouse name: Not on file   Number of children: Not on file   Years of education: Not on file   Highest education level: Not on file  Occupational History   Not on file  Tobacco Use   Smoking status: Every Day    Types: Cigarettes, Pipe, Cigars   Smokeless tobacco: Current    Types: Chew   Tobacco comments:    not since 1987   Vaping Use   Vaping Use: Never used  Substance and Sexual Activity   Alcohol use: Never   Drug use: Never   Sexual activity: Not Currently  Other Topics Concern   Not on file  Social History Narrative   Lives alone with a cat and a dog.  Has family support from brother, aunt, uncle and cousins.    Social  Determinants of Health   Financial Resource Strain: Low Risk  (08/10/2019)   Overall Financial Resource Strain (CARDIA)    Difficulty of Paying Living Expenses: Not very hard  Food Insecurity: Unknown (08/10/2019)   Hunger Vital Sign    Worried About Running Out of Food in the Last Year: Patient refused    Ran Out of Food in the Last Year: Patient refused  Transportation Needs: Unknown (08/10/2019)   PRAPARE - Administrator, Civil Service (Medical): Patient refused    Lack of Transportation (Non-Medical): Patient refused  Physical Activity: Unknown (08/10/2019)   Exercise Vital Sign    Days of Exercise per Week: Patient refused    Minutes of Exercise per Session: Patient refused  Stress: Stress Concern Present (08/10/2019)   [employer] of Occupational Health - Occupational Stress Questionnaire    Feeling of Stress : To some extent  Social Connections: Unknown (08/10/2019)   Social Connection and Isolation Panel [NHANES]    Frequency of Communication with Friends and Family: Patient refused    Frequency of Social Gatherings with Friends and Family: Patient refused    Attends Religious Services: Patient refused    Database administrator or Organizations: Patient refused    Attends Banker Meetings: Patient refused    Marital Status: Patient refused     Family History: The patient's family history includes Diabetes in his mother; Heart attack in his brother; Stroke in his father. ROS:   Please see the history of present illness.    All 14 point review of systems negative except as described per history of present illness.  EKGs/Labs/Other Studies Reviewed:    The following studies were reviewed today: I did review the test done in 2020  EKG:  EKG is  ordered today.  The ekg ordered today demonstrates normal sinus rhythm normal P interval evidence of anteroseptal wall MI, PVCs, nonspecific ST segment changes  Recent Labs: No results found for  requested labs within last 365 days.  Recent Lipid Panel    Component Value Date/Time   CHOL 269 (H) 08/08/2019 0625   TRIG 89 08/08/2019 0625   HDL 41 08/08/2019 0625   CHOLHDL 6.6 08/08/2019 0625   VLDL 18 08/08/2019 0625   LDLCALC 210 (H) 08/08/2019 0625    Physical Exam:    VS:  BP (!) 130/100 (BP Location: Left Arm, Patient Position: Sitting)   Pulse 96   Ht 6' (1.829 m)   Wt (!) 301 lb 6.4 oz (136.7 kg)   SpO2 98%   BMI 40.88 kg/m     Wt Readings from Last 3 Encounters:  08/09/22 Marland Kitchen)  301 lb 6.4 oz (136.7 kg)  07/20/22 (!) 310 lb (140.6 kg)  08/26/19 290 lb 9.6 oz (131.8 kg)     GEN:  Well nourished, well developed in no acute distress HEENT: Normal NECK: No JVD; No carotid bruits LYMPHATICS: No lymphadenopathy CARDIAC: RRR, tones are distant, holosystolic murmur grade 1/6 to 2/6 best heard left border sternum, no rubs, no gallops RESPIRATORY:  Clear to auscultation without rales, wheezing or rhonchi  ABDOMEN: Soft, non-tender, non-distended MUSCULOSKELETAL: 2+ swelling SKIN: Warm and dry NEUROLOGIC:  Alert and oriented x 3 PSYCHIATRIC:  Normal affect   ASSESSMENT:    1. Acute systolic CHF (congestive heart failure) (HCC)   2. Dyspnea on exertion   3. Type 2 diabetes mellitus without complication, without long-term current use of insulin (HCC)   4. CAD in native artery   5. Essential hypertension   6. Ischemic cardiomyopathy   7. Mixed hyperlipidemia   8. Morbid obesity (HCC)    PLAN:    In order of problems listed above:  Congestive heart failure.  He seems to be only mildly decompensated on the physical exam he takes 40 of Lasix in the morning 20 mg of Lasix in the evening time.  He does not take any potassium supplementation.  He is not on any guideline directed medical therapy.  Previously he was taking Aldactone metoprolol he did have a trial of losartan however developed dizziness and that was discontinued with intention to try to rechallenge later  but it never happened since he did not follow-up.  He is completely asymptomatic his condition he thinks he is doing very well he told me that he let me do his echocardiogram but he tells me straight that he is not going to see me following month in follow-up because he said that I am trying to "jink me".  I am not sure exactly what he meant by that expression regardless what we do today will be echocardiogram to reassess his left ventricle ejection fraction.  I will do complete metabolic panel proBNP as well as direct LDL trying to figure out how much more we can push with medication if you like me to. Diabetes mellitus poorly controlled, he is checking his sugar he said sugars typically between 160 and 200 at evening time usually higher however he does not want to take any medications for it he said he is fine he has been to control this with diet. Dyslipidemia: He used to be taking cholesterol medication but does not take any right now he thinks he does not need it Obesity: Obviously still a problem I am not sure he understands that he should  Overall it was difficult visit, identity he had understanding of his condition and how serious this condition is I think he is thinking is that we just trying to get money out of him and he does not think this testing is necessary.  Hopefully he will let us to do echocardiogram hopefully he will allow [place] to initiate some medications.  He is argumentation well asked that when his mother was 9 years old she was going every month to the doctor and she end up dying.  It is a sad understanding of the situation    Medication Adjustments/Labs and Tests Ordered: Current medicines are reviewed at length with the patient today.  Concerns regarding medicines are outlined above.  Orders Placed This Encounter  Procedures   Comprehensive metabolic panel   Hemoglobin A1c   LDL cholesterol, direct  EKG 12-Lead   ECHOCARDIOGRAM COMPLETE   No orders of the defined types  were placed in this encounter.   Signed, Georgeanna Leaobert J. Rehmat Murtagh, MD, [facility] HospitalFACC. 08/09/2022 11:31 AM    [facility]

## 2022-08-10 LAB — COMPREHENSIVE METABOLIC PANEL
ALT: 23 IU/L (ref 0–44)
AST: 14 IU/L (ref 0–40)
Albumin/Globulin Ratio: 1.9 (ref 1.2–2.2)
Albumin: 4.9 g/dL (ref 3.8–4.9)
Alkaline Phosphatase: 77 IU/L (ref 44–121)
BUN/Creatinine Ratio: 14 (ref 9–20)
BUN: 19 mg/dL (ref 6–24)
Bilirubin Total: 0.4 mg/dL (ref 0.0–1.2)
CO2: 26 mmol/L (ref 20–29)
Calcium: 10 mg/dL (ref 8.7–10.2)
Chloride: 100 mmol/L (ref 96–106)
Creatinine, Ser: 1.4 mg/dL — ABNORMAL HIGH (ref 0.76–1.27)
Globulin, Total: 2.6 g/dL (ref 1.5–4.5)
Glucose: 162 mg/dL — ABNORMAL HIGH (ref 70–99)
Potassium: 4.7 mmol/L (ref 3.5–5.2)
Sodium: 140 mmol/L (ref 134–144)
Total Protein: 7.5 g/dL (ref 6.0–8.5)
eGFR: 58 mL/min/{1.73_m2} — ABNORMAL LOW (ref >59)

## 2022-08-10 LAB — HEMOGLOBIN A1C
Est. average glucose Bld gHb Est-mCnc: 200 mg/dL
Hgb A1c MFr Bld: 8.6 % — ABNORMAL HIGH (ref 4.8–5.6)

## 2022-08-10 LAB — LDL CHOLESTEROL, DIRECT: LDL Direct: 135 mg/dL — ABNORMAL HIGH (ref 0–99)

## 2022-08-11 ENCOUNTER — Ambulatory Visit: Payer: Medicaid Other | Attending: Cardiology

## 2022-08-11 DIAGNOSIS — I5021 Acute systolic (congestive) heart failure: Secondary | ICD-10-CM | POA: Diagnosis not present

## 2022-08-11 DIAGNOSIS — R0609 Other forms of dyspnea: Secondary | ICD-10-CM

## 2022-08-11 LAB — ECHOCARDIOGRAM COMPLETE
Area-P 1/2: 6.96 cm2
MV M vel: 4.19 m/s
MV Peak grad: 70.1 mmHg
S' Lateral: 6.3 cm

## 2022-08-11 MED ORDER — PERFLUTREN LIPID MICROSPHERE
1.0000 mL | INTRAVENOUS | Status: AC | PRN
  Administered 2022-08-11: 6 mL via INTRAVENOUS

## 2022-08-16 ENCOUNTER — Telehealth: Payer: Self-pay

## 2022-08-16 DIAGNOSIS — I5021 Acute systolic (congestive) heart failure: Secondary | ICD-10-CM

## 2022-08-16 DIAGNOSIS — E782 Mixed hyperlipidemia: Secondary | ICD-10-CM

## 2022-08-16 DIAGNOSIS — I2510 Atherosclerotic heart disease of native coronary artery without angina pectoris: Secondary | ICD-10-CM

## 2022-08-16 MED ORDER — ROSUVASTATIN CALCIUM 20 MG PO TABS: 20.0000 mg | ORAL_TABLET | Freq: Every day | ORAL | 3 refills | Status: DC

## 2022-08-16 MED ORDER — CARVEDILOL 3.125 MG PO TABS: 3.1250 mg | ORAL_TABLET | Freq: Two times a day (BID) | ORAL | 3 refills | Status: DC

## 2022-08-16 NOTE — Telephone Encounter (Signed)
-----   Message from Georgeanna Lea, MD sent at 08/14/2022 10:20 AM EST ----- Echocardiogram shows severe newly reduced left ventricle ejection fraction, slight look like lemon juice did not help, please start carvedilol 3.125 twice daily

## 2022-08-17 ENCOUNTER — Telehealth: Payer: Self-pay | Admitting: Cardiology

## 2022-08-17 NOTE — Telephone Encounter (Signed)
  [place] with sistassis family calling to request a copy of pt's OV notes from his last visit with Dr. Kirtland Bouchard. She gave fax# [phone]

## 2022-08-17 NOTE — Telephone Encounter (Signed)
Last OV note sent via EPIC fax

## 2022-11-30 ENCOUNTER — Ambulatory Visit: Payer: Medicaid Other | Attending: Cardiology | Admitting: Cardiology

## 2022-11-30 ENCOUNTER — Encounter: Payer: Self-pay | Admitting: Cardiology

## 2022-11-30 VITALS — BP 144/94 | HR 78 | Ht 73.0 in | Wt 294.0 lb

## 2022-11-30 DIAGNOSIS — E782 Mixed hyperlipidemia: Secondary | ICD-10-CM

## 2022-11-30 DIAGNOSIS — I255 Ischemic cardiomyopathy: Secondary | ICD-10-CM | POA: Diagnosis not present

## 2022-11-30 NOTE — Patient Instructions (Signed)
Medication Instructions:  Your physician recommends that you continue on your current medications as directed. Please refer to the Current Medication list given to you today.  *If you need a refill on your cardiac medications before your next appointment, please call your pharmacy*   Lab Work: BMP today If you have labs (blood work) drawn today and your tests are completely normal, you will receive your results only by: MyChart Message (if you have MyChart) OR A paper copy in the mail If you have any lab test that is abnormal or we need to change your treatment, we will call you to review the results.   Testing/Procedures: None Ordered   Follow-Up: At [facility], you and your health needs are our priority.  As part of our continuing mission to provide you with exceptional heart care, we have created designated Provider Care Teams.  These Care Teams include your primary Cardiologist (physician) and Advanced Practice Providers (APPs -  Physician Assistants and Nurse Practitioners) who all work together to provide you with the care you need, when you need it.  We recommend signing up for the patient portal called "MyChart".  Sign up information is provided on this After Visit Summary.  MyChart is used to connect with patients for Virtual Visits (Telemedicine).  Patients are able to view lab/test results, encounter notes, upcoming appointments, etc.  Non-urgent messages can be sent to your provider as well.   To learn more about what you can do with MyChart, go to [email or web address].    Your next appointment:   3 month(s)  The format for your next appointment:   In Person  Provider:   Robert Krasowski, MD    Other Instructions NA  

## 2022-11-30 NOTE — Progress Notes (Signed)
Cardiology Office Note:    Date:  11/30/2022   ID:  Michael [name], DOB [birth year], MRN [id number]  PCP:  Charlotte Sanes, MD  Cardiologist:  Jenne Campus, MD    Referring MD: Park Liter, MD   No chief complaint on file. I am doing great  History of Present Illness:    Michael [name] is a 63 y.o. male past medical history significant for cardiomyopathy ejection fraction 20 to 25% which is ischemic in origin he had cardiac catheterization done which was found to have severe two-vessel disease however MRI done after that show no viability and he was disqualifies candidate for surgery he have some difficulty understanding seriousness of his condition and he comes to my office very rarely today he said he is doing great he does not want a problem he does have some swelling of lower extremities but his primary care physician dealing with that and he thinks these have no problem.  Past Medical History:  Diagnosis Date   CAD in native artery    a. multivessel by cath 07/2019 - felt to need CABG but needs improvement of diabetes + CHF first.   Chronic systolic CHF (congestive heart failure) ([place])    a. dx 07/2019.   Dyspnea    Essential hypertension    Hypercholesterolemia    Hyperlipidemia    Ischemic cardiomyopathy    LV (left ventricular) mural thrombus    a. dx 07/2019.   Morbid obesity ([place])    Myocardial infarction ([place])    Tobacco use    Uncontrolled diabetes mellitus     Past Surgical History:  Procedure Laterality Date   LEFT HEART CATH AND CORONARY ANGIOGRAPHY N/A 08/08/2019   Procedure: LEFT HEART CATH AND CORONARY ANGIOGRAPHY;  Surgeon: Belva Crome, MD;  Location: [place] [facility];  Service: Cardiovascular;  Laterality: N/A;    Current Medications: Current Meds  Medication Sig   aspirin EC 81 MG EC tablet Take 1 tablet (81 mg total) by mouth daily.   carvedilol (COREG) 3.125 MG tablet Take 1 tablet (3.125 mg total) by mouth 2 (two) times daily.   furosemide  (LASIX) 40 MG tablet Take 40 mg by mouth 2 (two) times daily.   loratadine (CLARITIN) 10 MG tablet Take 10 mg by mouth daily as needed for allergies.   rosuvastatin (CRESTOR) 20 MG tablet Take 1 tablet (20 mg total) by mouth daily.     Allergies:   Patient has no known allergies.   Social History   Socioeconomic History   Marital status: Single    Spouse name: Not on file   Number of children: Not on file   Years of education: Not on file   Highest education level: Not on file  Occupational History   Not on file  Tobacco Use   Smoking status: Every Day    Types: Cigarettes, Pipe, Cigars   Smokeless tobacco: Current    Types: Chew   Tobacco comments:    not since 1987   Vaping Use   Vaping Use: Never used  Substance and Sexual Activity   Alcohol use: Never   Drug use: Never   Sexual activity: Not Currently  Other Topics Concern   Not on file  Social History Narrative   Lives alone with a cat and a dog.  Has family support from brother, aunt, uncle and cousins.    Social Determinants of Health   Financial Resource Strain: Low Risk  (08/10/2019)   Overall Emergency planning/management officer Strain (  CARDIA)    Difficulty of Paying Living Expenses: Not very hard  Food Insecurity: Unknown (08/10/2019)   Hunger Vital Sign    Worried About Running Out of Food in the Last Year: Patient declined    [place] in the Last Year: Patient declined  Transportation Needs: Unknown (08/10/2019)   [place] - Hydrologist (Medical): Patient declined    Lack of Transportation (Non-Medical): Patient declined  Physical Activity: Unknown (08/10/2019)   Exercise Vital Sign    Days of Exercise per Week: Patient declined    Minutes of Exercise per Session: Patient declined  Stress: Stress Concern Present (08/10/2019)   Pinch    Feeling of Stress : To some extent  Social Connections: Unknown (08/10/2019)    Social Connection and Isolation Panel [NHANES]    Frequency of Communication with Friends and Family: Patient declined    Frequency of Social Gatherings with Friends and Family: Patient declined    Attends Religious Services: Patient declined    Marine scientist or Organizations: Patient declined    Attends Archivist Meetings: Patient declined    Marital Status: Patient declined     Family History: The patient's family history includes Diabetes in his mother; Heart attack in his brother; Stroke in his father. ROS:   Please see the history of present illness.    All 14 point review of systems negative except as described per history of present illness  EKGs/Labs/Other Studies Reviewed:      Recent Labs: 08/09/2022: ALT 23; BUN 19; Creatinine, Ser 1.40; Potassium 4.7; Sodium 140  Recent Lipid Panel    Component Value Date/Time   CHOL 269 (H) 08/08/2019 0625   TRIG 89 08/08/2019 0625   HDL 41 08/08/2019 0625   CHOLHDL 6.6 08/08/2019 0625   VLDL 18 08/08/2019 0625   LDLCALC 210 (H) 08/08/2019 0625   LDLDIRECT 135 (H) 08/09/2022 1042    Physical Exam:    VS:  BP (!) 144/94   Pulse 78   Ht 6\' 1"  (1.854 m)   Wt 294 lb (133.4 kg)   SpO2 97%   BMI 38.79 kg/m     Wt Readings from Last 3 Encounters:  11/30/22 294 lb (133.4 kg)  08/09/22 (!) 301 lb 6.4 oz (136.7 kg)  07/20/22 (!) 310 lb (140.6 kg)     GEN:  Well nourished, well developed in no acute distress HEENT: Normal NECK: No JVD; No carotid bruits LYMPHATICS: No lymphadenopathy CARDIAC: RRR, no murmurs, no rubs, no gallops RESPIRATORY:  Clear to auscultation without rales, wheezing or rhonchi  ABDOMEN: Soft, non-tender, non-distended MUSCULOSKELETAL:  No edema; No deformity  SKIN: Warm and dry LOWER EXTREMITIES: no swelling NEUROLOGIC:  Alert and oriented x 3 PSYCHIATRIC:  Normal affect   ASSESSMENT:    1. Ischemic cardiomyopathy   2. Morbid obesity ([place])   3. Mixed hyperlipidemia     PLAN:    In order of problems listed above:  Ischemic cardiomyopathy which is severe.  He is only on beta-blocker.  I will try to put him on Entresto.  Will check Chem-7 if Chem-7 is fine we initiated Entresto 24-26 twice daily Morbid obesity contributing factor.  Obviously would be beneficial for him to lose some weight. Dyslipidemia he is taking Crestor 20 I will call primary care physician to get fasting lipid profile. Is a very sad situation and I still do not think a full  grasp of the seriousness of his condition.  He thinks doing absolutely fine and he is not sure exactly why he is coming to our office for follow-up.   Medication Adjustments/Labs and Tests Ordered: Current medicines are reviewed at length with the patient today.  Concerns regarding medicines are outlined above.  No orders of the defined types were placed in this encounter.  Medication changes: No orders of the defined types were placed in this encounter.   Signed, Park Liter, MD, [facility] 11/30/2022 1:11 PM    [place]

## 2022-12-01 ENCOUNTER — Telehealth: Payer: Self-pay

## 2022-12-01 DIAGNOSIS — I10 Essential (primary) hypertension: Secondary | ICD-10-CM

## 2022-12-01 LAB — BASIC METABOLIC PANEL
BUN/Creatinine Ratio: 16 (ref 9–20)
BUN: 21 mg/dL (ref 6–24)
CO2: 26 mmol/L (ref 20–29)
Calcium: 9.4 mg/dL (ref 8.7–10.2)
Chloride: 101 mmol/L (ref 96–106)
Creatinine, Ser: 1.33 mg/dL — ABNORMAL HIGH (ref 0.76–1.27)
Glucose: 155 mg/dL — ABNORMAL HIGH (ref 70–99)
Potassium: 4.3 mmol/L (ref 3.5–5.2)
Sodium: 140 mmol/L (ref 134–144)
eGFR: 62 mL/min/{1.73_m2} (ref >59)

## 2022-12-01 MED ORDER — LOSARTAN POTASSIUM 25 MG PO TABS: 25.0000 mg | ORAL_TABLET | Freq: Every day | ORAL | 3 refills | Status: DC

## 2022-12-01 NOTE — Telephone Encounter (Signed)
Spoke with pt regarding lab results per Dr. Wendy Poet note. Sent Losartan to [facility] and he agreed to come back for labs in 1 week. Routed to PCP

## 2022-12-16 LAB — BASIC METABOLIC PANEL
BUN/Creatinine Ratio: 16 (ref 9–20)
BUN: 22 mg/dL (ref 6–24)
CO2: 23 mmol/L (ref 20–29)
Calcium: 9.4 mg/dL (ref 8.7–10.2)
Chloride: 99 mmol/L (ref 96–106)
Creatinine, Ser: 1.34 mg/dL — ABNORMAL HIGH (ref 0.76–1.27)
Glucose: 182 mg/dL — ABNORMAL HIGH (ref 70–99)
Potassium: 4.5 mmol/L (ref 3.5–5.2)
Sodium: 139 mmol/L (ref 134–144)
eGFR: 61 mL/min/{1.73_m2} (ref >59)

## 2022-12-20 ENCOUNTER — Telehealth: Payer: Self-pay

## 2022-12-20 NOTE — Telephone Encounter (Signed)
-----   Message from Georgeanna Lea, MD sent at 12/20/2022  1:48 PM EDT ----- Chem-7 looks stable, continue present management

## 2022-12-20 NOTE — Telephone Encounter (Signed)
Patient notified of results.

## 2023-03-07 ENCOUNTER — Ambulatory Visit: Payer: Medicaid Other | Admitting: Cardiology

## 2023-04-09 DIAGNOSIS — Z720 Tobacco use: Secondary | ICD-10-CM | POA: Insufficient documentation

## 2023-04-09 DIAGNOSIS — I219 Acute myocardial infarction, unspecified: Secondary | ICD-10-CM | POA: Insufficient documentation

## 2023-04-09 DIAGNOSIS — E7800 Pure hypercholesterolemia, unspecified: Secondary | ICD-10-CM | POA: Insufficient documentation

## 2023-04-09 DIAGNOSIS — I5022 Chronic systolic (congestive) heart failure: Secondary | ICD-10-CM | POA: Insufficient documentation

## 2023-04-09 DIAGNOSIS — R0600 Dyspnea, unspecified: Secondary | ICD-10-CM | POA: Insufficient documentation

## 2023-04-16 ENCOUNTER — Ambulatory Visit: Payer: Medicaid Other | Attending: Cardiology | Admitting: Cardiology

## 2023-04-16 ENCOUNTER — Encounter: Payer: Self-pay | Admitting: Cardiology

## 2023-04-16 VITALS — BP 144/90 | HR 82 | Ht 72.6 in | Wt 298.0 lb

## 2023-04-16 DIAGNOSIS — I10 Essential (primary) hypertension: Secondary | ICD-10-CM

## 2023-04-16 DIAGNOSIS — I5022 Chronic systolic (congestive) heart failure: Secondary | ICD-10-CM

## 2023-04-16 DIAGNOSIS — E7800 Pure hypercholesterolemia, unspecified: Secondary | ICD-10-CM

## 2023-04-16 MED ORDER — ENTRESTO 24-26 MG PO TABS: 1.0000 | ORAL_TABLET | Freq: Two times a day (BID) | ORAL | 3 refills | Status: DC

## 2023-04-16 NOTE — Addendum Note (Signed)
Addended by: Roxanne Mins I on: 04/16/2023 11:48 AM   Modules accepted: Orders

## 2023-04-16 NOTE — Patient Instructions (Signed)
Medication Instructions:  Your physician has recommended you make the following change in your medication:   START: Entresto 24-26 1 tablet twice daily STOP: Losartan  *If you need a refill on your cardiac medications before your next appointment, please call your pharmacy*   Lab Work: Your physician recommends that you return for lab work in:   Labs 1 week: BMP  If you have labs (blood work) drawn today and your tests are completely normal, you will receive your results only by: MyChart Message (if you have MyChart) OR A paper copy in the mail If you have any lab test that is abnormal or we need to change your treatment, we will call you to review the results.   Testing/Procedures: None   Follow-Up: At [facility], you and your health needs are our priority.  As part of our continuing mission to provide you with exceptional heart care, we have created designated Provider Care Teams.  These Care Teams include your primary Cardiologist (physician) and Advanced Practice Providers (APPs -  Physician Assistants and Nurse Practitioners) who all work together to provide you with the care you need, when you need it.  We recommend signing up for the patient portal called "MyChart".  Sign up information is provided on this After Visit Summary.  MyChart is used to connect with patients for Virtual Visits (Telemedicine).  Patients are able to view lab/test results, encounter notes, upcoming appointments, etc.  Non-urgent messages can be sent to your provider as well.   To learn more about what you can do with MyChart, go to [email or web address].    Your next appointment:   2 month(s)  Provider:   Gypsy Balsam, MD    Other Instructions None

## 2023-04-16 NOTE — Progress Notes (Signed)
Cardiology Office Note:    Date:  04/16/2023   ID:  Michael [name], DOB 1960-10-31, MRN [id number]  PCP:  Shelle Iron, MD  Cardiologist:  Gypsy Balsam, MD    Referring MD: Shelle Iron, MD   No chief complaint on file.   History of Present Illness:    Michael [name] is a 61 y.o. male past medical history significant for chronic cardiomyopathy ejection fraction 20 to 25% which is ischemic in origin cardiac catheterization do not show severe two-vessel coronary artery disease however MRI done thereafter showed no viability therefore he was disqualified as candidate for surgery I will try to put him on guideline directed medical therapy with some difficulties I think the biggest problem is his lack of understanding of seriousness of his problem.  I will try to put him today on Entresto and see if he can tolerate it.  Now he is without diuretic which hopefully give me room to put him on appropriate medications.  He claims that he does not have any symptoms there is no chest pain tightness squeezing pressure burning chest no shortness of breath no palpitation he is fine he thinks  Past Medical History:  Diagnosis Date   Acute systolic CHF (congestive heart failure) (HCC)    CAD in native artery    a. multivessel by cath 07/2019 - felt to need CABG but needs improvement of diabetes + CHF first.   Chronic systolic CHF (congestive heart failure) (HCC)    a. dx 07/2019.   Dyspnea    Essential hypertension    Hypercholesterolemia    Hyperlipidemia    Ischemic cardiomyopathy    LV (left ventricular) mural thrombus    a. dx 07/2019.   Morbid obesity (HCC)    Myocardial infarction [facility])    NSTEMI (non-ST elevated myocardial infarction) (HCC) 08/08/2019   Tobacco use    Uncontrolled diabetes mellitus     Past Surgical History:  Procedure Laterality Date   LEFT HEART CATH AND CORONARY ANGIOGRAPHY N/A 08/08/2019   Procedure: LEFT HEART CATH AND CORONARY ANGIOGRAPHY;  Surgeon: Lyn Records,  MD;  Location: [facility] INV[facility] LAB;  Service: Cardiovascular;  Laterality: N/A;    Current Medications: Current Meds  Medication Sig   aspirin EC 81 MG EC tablet Take 1 tablet (81 mg total) by mouth daily.   carvedilol (COREG) 3.125 MG tablet Take 1 tablet (3.125 mg total) by mouth 2 (two) times daily.   loratadine (CLARITIN) 10 MG tablet Take 10 mg by mouth daily as needed for allergies.   losartan (COZAAR) 25 MG tablet Take 1 tablet (25 mg total) by mouth daily.   rosuvastatin (CRESTOR) 20 MG tablet Take 1 tablet (20 mg total) by mouth daily.     Allergies:   Patient has no known allergies.   Social History   Socioeconomic History   Marital status: Single    Spouse name: Not on file   Number of children: Not on file   Years of education: Not on file   Highest education level: Not on file  Occupational History   Not on file  Tobacco Use   Smoking status: Every Day    Types: Cigarettes, Pipe, Cigars   Smokeless tobacco: Current    Types: Chew   Tobacco comments:    not since 1987   Vaping Use   Vaping status: Never Used  Substance and Sexual Activity   Alcohol use: Never   Drug use: Never   Sexual activity: Not Currently  Other Topics Concern   Not on file  Social History Narrative   Lives alone with a cat and a dog.  Has family support from brother, aunt, uncle and cousins.    Social Determinants of Health   Financial Resource Strain: Low Risk  (08/10/2019)   Overall Financial Resource Strain (CARDIA)    Difficulty of Paying Living Expenses: Not very hard  Food Insecurity: Unknown (08/10/2019)   Hunger Vital Sign    Worried About Running Out of Food in the Last Year: Patient declined    Ran Out of Food in the Last Year: Patient declined  Transportation Needs: Unknown (08/10/2019)   PRAPARE - Administrator, Civil Service (Medical): Patient declined    Lack of Transportation (Non-Medical): Patient declined  Physical Activity: Unknown (08/10/2019)    Exercise Vital Sign    Days of Exercise per Week: Patient declined    Minutes of Exercise per Session: Patient declined  Stress: Stress Concern Present (08/10/2019)   [employer] of Occupational Health - Occupational Stress Questionnaire    Feeling of Stress : To some extent  Social Connections: Unknown (08/10/2019)   Social Connection and Isolation Panel [NHANES]    Frequency of Communication with Friends and Family: Patient declined    Frequency of Social Gatherings with Friends and Family: Patient declined    Attends Religious Services: Patient declined    Database administrator or Organizations: Patient declined    Attends Banker Meetings: Patient declined    Marital Status: Patient declined     Family History: The patient's family history includes Diabetes in his mother; Heart attack in his brother; Stroke in his father. ROS:   Please see the history of present illness.    All 14 point review of systems negative except as described per history of present illness  EKGs/Labs/Other Studies Reviewed:         Recent Labs: 08/09/2022: ALT 23 12/15/2022: BUN 22; Creatinine, Ser 1.34; Potassium 4.5; Sodium 139  Recent Lipid Panel    Component Value Date/Time   CHOL 269 (H) 08/08/2019 0625   TRIG 89 08/08/2019 0625   HDL 41 08/08/2019 0625   CHOLHDL 6.6 08/08/2019 0625   VLDL 18 08/08/2019 0625   LDLCALC 210 (H) 08/08/2019 0625   LDLDIRECT 135 (H) 08/09/2022 1042    Physical Exam:    VS:  BP (!) 144/90   Pulse 82   Ht 6' 0.6" (1.844 m)   Wt 298 lb (135.2 kg)   SpO2 96%   BMI 39.75 kg/m     Wt Readings from Last 3 Encounters:  04/16/23 298 lb (135.2 kg)  11/30/22 294 lb (133.4 kg)  08/09/22 (!) 301 lb 6.4 oz (136.7 kg)     GEN:  Well nourished, well developed in no acute distress HEENT: Normal NECK: No JVD; No carotid bruits LYMPHATICS: No lymphadenopathy CARDIAC: RRR, no murmurs, no rubs, no gallops RESPIRATORY:  Clear to auscultation  without rales, wheezing or rhonchi  ABDOMEN: Soft, non-tender, non-distended MUSCULOSKELETAL:  No edema; No deformity  SKIN: Warm and dry LOWER EXTREMITIES: no swelling NEUROLOGIC:  Alert and oriented x 3 PSYCHIATRIC:  Normal affect   ASSESSMENT:    1. Chronic systolic CHF (congestive heart failure) (HCC)   2. Essential hypertension   3. Morbid obesity (HCC)   4. Hypercholesterolemia    PLAN:    In order of problems listed above:  Ischemic cardiomyopathy severely reduced left ventricle ejection fraction difficulty putting him on appropriate  medications.  Today we will stop losartan start Entresto 24-26 will check Chem-7 next week. Essential hypertension still uncontrolled hopefully addition of Entresto will make it better. Obesity obviously a problem he understands that he will try to reduce some weight Dyslipidemia he is taking Crestor 20 which I will continue.  Will check his fasting lipid profile   Medication Adjustments/Labs and Tests Ordered: Current medicines are reviewed at length with the patient today.  Concerns regarding medicines are outlined above.  No orders of the defined types were placed in this encounter.  Medication changes: No orders of the defined types were placed in this encounter.   Signed, Georgeanna Lea, MD, [facility] 04/16/2023 11:34 AM    [facility]

## 2023-05-01 LAB — BASIC METABOLIC PANEL: Creatinine, Ser: 1.32 mg/dL — ABNORMAL HIGH (ref 0.76–1.27)

## 2023-05-04 ENCOUNTER — Telehealth: Payer: Self-pay

## 2023-05-04 NOTE — Telephone Encounter (Signed)
Lab Results reviewed with pt as per Dr. Vanetta Shawl note.  Pt verbalized understanding and had no additional questions. Routed to PCP

## 2023-06-15 NOTE — Progress Notes (Deleted)
Cardiology Office Note:  .   Date:  06/15/2023  ID:  Jerremy Swaziland, DOB March 03, 1961, MRN [id number] PCP: Shelle Iron, MD  [facility] Providers Cardiologist:  Verne Carrow, MD { Click to update primary MD,subspecialty MD or APP then REFRESH:1}   History of Present Illness: .   Dantonio Swaziland is a 62 y.o. male with a past medical history of CAD, ischemic cardiomyopathy, DM2, dyslipidemia, tobacco abuse  08/11/2022 echo EF 20 to 25%, LV moderately dilated, grade 3 restrictive dysfunction, moderately elevated PASP, mild MR. 08/10/2019 cMRI severe LV dilatation EF 19%, greater than 50% transmural late gadolinium enhancement suggesting nonviability 08/08/2019 left heart cath multivessel coronary artery disease  Most recently evaluated by Dr. Bing Matter on 04/16/2023, he had previously been diagnosed with ischemic cardiomyopathy with a EF of 20 to 25%, cardiac MRI revealed there was no viability so he was disqualified for any surgical interventions.  Felt he would likely need CABG however his poorly controlled diabetes and congestive heart failure was prohibitive to this.  Decision was made to start [facility].  He was advised to follow-up in 2 months.  Repeat BMET ROS: ROS   Studies Reviewed: .        Cardiac Studies & Procedures   CARDIAC CATHETERIZATION  CARDIAC CATHETERIZATION 08/08/2019  Narrative  Acute on chronic combined systolic and diastolic heart failure, EF 25 to 35%, with LVEDP 32 to 35 mmHg.  Widely patent left main  Diffusely diseased proximal to mid LAD up to 90% stenosis throughout the segment.  Diffuse disease mid to distal LAD without focal high-grade obstruction.  First diagonal is relatively large but contains severe proximal to mid diffuse disease, greater than 85%.  Large ramus intermedius that supplies collaterals to the distal right coronary.  30% proximal narrowing.  Circumflex contains diffuse 50% proximal narrowing.  First obtuse marginal is 85 to  90%.  Right coronary arises from a separate ostium than the conus branch.  RCA contains proximal to mid eccentric 85% stenosis.  PDA contains proximal to mid severe diffuse disease with competition from left to right collaterals.  The continuation of the right coronary beyond the PDA contains high-grade obstruction with right to right and left to right collaterals noted.  RECOMMENDATIONS:   IV nitroglycerin and IV Lasix have been ordered to help treat heart failure.  IV nitroglycerin will also serve to improve myocardial perfusion.  Heart failure optimization will allow consideration of revascularization.  The patient may need a myocardial viability study.  Spoke with Dr. Clifton James.  The patient is clinically tenuous and if he does not respond to the measures outlined above, advanced heart failure service should be consulted.  Findings Coronary Findings Diagnostic  Dominance: Right  Left Anterior Descending There is moderate diffuse disease throughout the vessel. Ost LAD to Mid LAD lesion is 90% stenosed.  First Diagonal Branch Vessel is moderate in size. 1st Diag lesion is 80% stenosed.  Ramus Intermedius Vessel is large. Ramus lesion is 30% stenosed.  Left Circumflex Vessel is small. Prox Cx to Mid Cx lesion is 50% stenosed.  First Obtuse Marginal Branch 1st Mrg lesion is 85% stenosed.  Right Coronary Artery Prox RCA lesion is 85% stenosed.  Right Posterior Descending Artery Collaterals RPDA filled by collaterals from Dist LAD.  RPDA lesion is 85% stenosed.  Right Posterior Atrioventricular Artery RPAV lesion is 85% stenosed.  Third Right Posterolateral Branch Collaterals 3rd RPL filled by collaterals from Ramus.  Intervention  No interventions have been documented.  ECHOCARDIOGRAM  ECHOCARDIOGRAM COMPLETE 08/11/2022  Narrative ECHOCARDIOGRAM REPORT    Patient Name:   Edgar Swaziland Date of Exam: 08/11/2022 Medical Rec #:  [id number]  Height:        72.0 in Accession #:    [id number] Weight:       301.4 lb Date of Birth:  01/31/1961  BSA:          2.536 m Patient Age:    61 years   BP:           130/100 mmHg Patient Gender: M          HR:           98 bpm. Exam Location:  [address]  Procedure: 2D Echo, Cardiac Doppler, Color Doppler and Intracardiac Opacification Agent  Indications:    Acute systolic CHF (congestive heart failure) (HCC) [I50.21 (ICD-10-CM)]; Dyspnea on exertion [R06.09 (ICD-10-CM)]  History:        Patient has prior history of Echocardiogram examinations, most recent 08/09/2019. CHF, Previous Myocardial Infarction and CAD; Risk Factors:Hypertension and Dyslipidemia.  Sonographer:    Margreta Journey RDCS Referring Phys: [id number] ROBERT J KRASOWSKI  IMPRESSIONS   1. Left ventricular ejection fraction, by estimation, is 20 to 25%. Left ventricular ejection fraction by PLAX is 24 %. The left ventricle has severely decreased function. The left ventricle has no regional wall motion abnormalities. The left ventricular internal cavity size was moderately dilated. Left ventricular diastolic parameters are consistent with Grade III diastolic dysfunction (restrictive). 2. Right ventricular systolic function is normal. The right ventricular size is normal. There is moderately elevated pulmonary artery systolic pressure. 3. The mitral valve is normal in structure. Mild mitral valve regurgitation. No evidence of mitral stenosis. 4. The aortic valve is normal in structure. Aortic valve regurgitation is not visualized. No aortic stenosis is present. 5. The inferior vena cava is normal in size with greater than 50% respiratory variability, suggesting right atrial pressure of 3 mmHg.  FINDINGS Left Ventricle: Left ventricular ejection fraction, by estimation, is 20 to 25%. Left ventricular ejection fraction by PLAX is 24 %. The left ventricle has severely decreased function. The left ventricle has no regional wall motion  abnormalities. Definity contrast agent was given IV to delineate the left ventricular endocardial borders. The left ventricular internal cavity size was moderately dilated. There is no left ventricular hypertrophy. Left ventricular diastolic parameters are consistent with Grade III diastolic dysfunction (restrictive).  Right Ventricle: The right ventricular size is normal. No increase in right ventricular wall thickness. Right ventricular systolic function is normal. There is moderately elevated pulmonary artery systolic pressure. The tricuspid regurgitant velocity is 3.55 m/s, and with an assumed right atrial pressure of 3 mmHg, the estimated right ventricular systolic pressure is 53.4 mmHg.  Left Atrium: Left atrial size was normal in size.  Right Atrium: Right atrial size was normal in size.  Pericardium: There is no evidence of pericardial effusion.  Mitral Valve: The mitral valve is normal in structure. Mild mitral valve regurgitation. No evidence of mitral valve stenosis.  Tricuspid Valve: The tricuspid valve is normal in structure. Tricuspid valve regurgitation is mild . No evidence of tricuspid stenosis.  Aortic Valve: The aortic valve is normal in structure. Aortic valve regurgitation is not visualized. No aortic stenosis is present.  Pulmonic Valve: The pulmonic valve was normal in structure. Pulmonic valve regurgitation is not visualized. No evidence of pulmonic stenosis.  Aorta: The aortic root is normal in size and structure.  Venous: The inferior vena  cava is normal in size with greater than 50% respiratory variability, suggesting right atrial pressure of 3 mmHg.  IAS/Shunts: No atrial level shunt detected by color flow Doppler.   LEFT VENTRICLE PLAX 2D LV EF:         Left            Diastology ventricular     LV e' medial:    8.27 cm/s ejection        LV E/e' medial:  17.9 fraction by     LV e' lateral:   7.72 cm/s PLAX is 24      LV E/e' lateral: 19.2 %. LVIDd:          7.10 cm LVIDs:         6.30 cm LV PW:         0.90 cm LV IVS:        0.80 cm LVOT diam:     2.30 cm LV SV:         46 LV SV Index:   18 LVOT Area:     4.15 cm   RIGHT VENTRICLE             IVC RV Basal diam:  4.10 cm     IVC diam: 2.00 cm RV S prime:     10.60 cm/s TAPSE (M-mode): 1.7 cm  LEFT ATRIUM              Index        RIGHT ATRIUM           Index LA diam:        4.50 cm  1.77 cm/m   RA Area:     20.20 cm LA Vol (A2C):   102.0 ml 40.22 ml/m  RA Volume:   56.00 ml  22.08 ml/m LA Vol (A4C):   86.9 ml  34.26 ml/m LA Biplane Vol: 95.9 ml  37.81 ml/m AORTIC VALVE LVOT Vmax:   73.20 cm/s LVOT Vmean:  51.500 cm/s LVOT VTI:    0.111 m  AORTA Ao Root diam: 4.00 cm Ao Asc diam:  3.20 cm Ao Desc diam: 2.60 cm  MITRAL VALVE                TRICUSPID VALVE MV Area (PHT): 6.96 cm     TR Peak grad:   50.4 mmHg MV Decel Time: 109 msec     TR Vmax:        355.00 cm/s MR Peak grad: 70.1 mmHg MR Mean grad: 43.0 mmHg     SHUNTS MR Vmax:      418.50 cm/s   Systemic VTI:  0.11 m MR Vmean:     310.0 cm/s    Systemic Diam: 2.30 cm MV E velocity: 148.00 cm/s  Gypsy Balsam MD Electronically signed by Gypsy Balsam MD Signature Date/Time: 08/11/2022/4:22:40 PM    Final      CARDIAC MRI  MR CARDIAC MORPHOLOGY W WO CONTRAST 08/11/2019  Narrative CLINICAL DATA:  Severe multivessel coronary disease, evaluate for viability  EXAM: CARDIAC MRI  TECHNIQUE: The patient was scanned on a 1.5 Tesla Siemens magnet. A dedicated cardiac coil was used. Functional imaging was done using Fiesta sequences. 2,3, and 4 chamber views were done to assess for RWMA's. Modified Simpson's rule using a short axis stack was used to calculate an ejection fraction on a dedicated work [employer]. The patient received 10cc of Gadavist. After 10 minutes inversion recovery sequences were used to assess  for infiltration and scar tissue.  CONTRAST:  10 cc  of  Gadavist  FINDINGS: Left ventricle:  - Severe dilatation  - Severe systolic dysfunction. Global hypokinesis with inferior akinesis  - LGE:  >50% transmural: Basal inferior, mid inferior, apical anterior, apical inferior, apical lateral, apex  < 50% transmural: Mid anterior, mid anterolateral, mid inferolateral  LV EF: 19% (Normal 56-78%)  Absolute volumes:  LV EDV: (Normal 77-195 mL)  LV ESV: (Normal 19-72 mL)  LV SV: 75mL (Normal 51-133 mL)  CO: 6.5L/min (Normal 2.8-8.8 L/min)  Indexed volumes:  LV EDV: 145mL/sq-m (Normal 47-92 mL/sq-m)  LV ESV: 146mL/sq-m (Normal 13-30 mL/sq-m)  LV SV: 19mL/sq-m (Normal 32-62 mL/sq-m)  CI: 2.4L/min/sq-m (Normal 1.7-4.2 L/min/sq-m)  Right ventricle: Normal size with mild systolic dysfunction  RV EF:  11% (Normal 47-74%)  Absolute volumes:  RV EDV: (Normal 88-227 mL)  RV ESV: (Normal 23-103 mL)  RV SV: 79mL (Normal 52-138 mL)  CO: 6.8L/min (Normal 2.8-8.8 L/min)  Indexed volumes:  RV EDV: 59mL/sq-m (Normal 55-105 mL/sq-m)  RV ESV: 18mL/sq-m (Normal 15-43 mL/sq-m)  RV SV: 48mL/sq-m (Normal 32-64 mL/sq-m)  CI: 2.5L/min/sq-m (Normal 1.7-4.2 L/min/sq-m)  IMPRESSION: 1. Limited exam as patient unable to tolerate lying flat in the MRI scanner  2.  Severe LV dilatation with severe systolic dysfunction (EF 19%)  3.  Normal RV size with mild systolic dysfunction (EF 43%)  4. There is greater than 50% transmural late gadolinium enhancement suggesting nonviability in the basal to apical inferior walls, apical anterior/lateral walls, and apex   Electronically Signed By: Epifanio Lesches MD On: 08/11/2019 23:21         Risk Assessment/Calculations:   {Does this patient have ATRIAL FIBRILLATION?[phone]} No BP recorded.  {Refresh Note OR Click here to enter BP  :1}***       Physical Exam:   VS:  There were no vitals taken for this visit.   Wt Readings from Last 3 Encounters:   04/16/23 298 lb (135.2 kg)  11/30/22 294 lb (133.4 kg)  08/09/22 (!) 301 lb 6.4 oz (136.7 kg)    GEN: Well nourished, well developed in no acute distress NECK: No JVD; No carotid bruits CARDIAC: ***RRR, no murmurs, rubs, gallops RESPIRATORY:  Clear to auscultation without rales, wheezing or rhonchi  ABDOMEN: Soft, non-tender, non-distended EXTREMITIES:  No edema; No deformity   ASSESSMENT AND PLAN: .   Ischemic cardiomyopathy- CAD- DM2-    {Are you ordering a CV Procedure (e.g. stress test, cath, DCCV, TEE, etc)?   Press F2        :[id number]}  Dispo: ***  Signed, Flossie Dibble, NP

## 2023-06-18 ENCOUNTER — Ambulatory Visit: Payer: Medicaid Other | Attending: Cardiology | Admitting: Cardiology

## 2023-06-18 DIAGNOSIS — E119 Type 2 diabetes mellitus without complications: Secondary | ICD-10-CM

## 2023-06-18 DIAGNOSIS — I5020 Unspecified systolic (congestive) heart failure: Secondary | ICD-10-CM

## 2023-06-18 DIAGNOSIS — I2510 Atherosclerotic heart disease of native coronary artery without angina pectoris: Secondary | ICD-10-CM

## 2023-07-20 LAB — LAB REPORT - SCANNED
A1c: 9.2
EGFR: 61
TSH: 2.13 (ref 0.41–5.90)
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
## 2023-07-23 ENCOUNTER — Encounter: Payer: Self-pay | Admitting: Cardiology

## 2023-07-23 ENCOUNTER — Ambulatory Visit: Payer: Medicaid Other | Attending: Cardiology | Admitting: Cardiology

## 2023-07-23 VITALS — BP 133/88 | HR 83 | Ht 72.6 in | Wt 296.0 lb

## 2023-07-23 DIAGNOSIS — I2510 Atherosclerotic heart disease of native coronary artery without angina pectoris: Secondary | ICD-10-CM

## 2023-07-23 DIAGNOSIS — I5020 Unspecified systolic (congestive) heart failure: Secondary | ICD-10-CM

## 2023-07-23 DIAGNOSIS — E782 Mixed hyperlipidemia: Secondary | ICD-10-CM | POA: Diagnosis not present

## 2023-07-23 DIAGNOSIS — E1169 Type 2 diabetes mellitus with other specified complication: Secondary | ICD-10-CM

## 2023-07-23 DIAGNOSIS — Z79899 Other long term (current) drug therapy: Secondary | ICD-10-CM

## 2023-07-23 MED ORDER — DAPAGLIFLOZIN PROPANEDIOL 10 MG PO TABS: 10.0000 mg | ORAL_TABLET | Freq: Every day | ORAL | 3 refills | Status: DC

## 2023-07-23 MED ORDER — DAPAGLIFLOZIN PROPANEDIOL 10 MG PO TABS: 10.0000 mg | ORAL_TABLET | Freq: Every day | ORAL | 0 refills | Status: DC

## 2023-07-23 NOTE — Patient Instructions (Signed)
Medication Instructions:  Your physician has recommended you make the following change in your medication:  Start Farxiga 10 mg once daily (samples given in office) *If you need a refill on your cardiac medications before your next appointment, please call your pharmacy*   Lab Work: Your physician recommends that you return for lab work in: 2 weeks for Basic Metabolic Panel Lab opens at 8am. You DO NOT NEED an appointment. Best time to come is between 8am and 11:45 and between 1:30 and 4:30. If you have been asked to fast for your blood work please have nothing to eat or drink after midnight. You may have water.   If you have labs (blood work) drawn today and your tests are completely normal, you will receive your results only by: MyChart Message (if you have MyChart) OR A paper copy in the mail If you have any lab test that is abnormal or we need to change your treatment, we will call you to review the results.   Testing/Procedures: NONE   Follow-Up: At [facility], you and your health needs are our priority.  As part of our continuing mission to provide you with exceptional heart care, we have created designated Provider Care Teams.  These Care Teams include your primary Cardiologist (physician) and Advanced Practice Providers (APPs -  Physician Assistants and Nurse Practitioners) who all work together to provide you with the care you need, when you need it.  We recommend signing up for the patient portal called "MyChart".  Sign up information is provided on this After Visit Summary.  MyChart is used to connect with patients for Virtual Visits (Telemedicine).  Patients are able to view lab/test results, encounter notes, upcoming appointments, etc.  Non-urgent messages can be sent to your provider as well.   To learn more about what you can do with MyChart, go to [email or web address].    Your next appointment:   6 week(s)  Provider:   Gypsy Balsam, MD    Other  Instructions

## 2023-07-27 ENCOUNTER — Telehealth: Payer: Self-pay | Admitting: Pharmacy Technician

## 2023-07-27 ENCOUNTER — Other Ambulatory Visit (HOSPITAL_COMMUNITY): Payer: Self-pay

## 2023-07-27 ENCOUNTER — Telehealth: Payer: Self-pay | Admitting: *Deleted

## 2023-07-27 NOTE — Telephone Encounter (Signed)
Pharmacy Patient Advocate Encounter   Received notification from Pt Calls Messages that prior authorization for Marcelline Deist is required/requested.   Insurance verification completed.   The patient is insured through [facility] .   Per test claim: PA required; PA submitted to above mentioned insurance via CoverMyMeds Key/confirmation #/EOC [id number] Status is pending

## 2023-07-27 NOTE — Telephone Encounter (Signed)
CoverMyMeds sent fax stating the Marcelline Deist for this pt needs a PA.  Key: [id number] Patient Last Name: [place] DOB: 06-06-1961

## 2023-07-31 ENCOUNTER — Other Ambulatory Visit (HOSPITAL_COMMUNITY): Payer: Self-pay

## 2023-07-31 NOTE — Telephone Encounter (Signed)
Let pt know that the Marcelline Deist has been approved and he can pick up at pharmacy. Pt verbalized understanding and had no further questions.

## 2023-07-31 NOTE — Telephone Encounter (Signed)
Pharmacy Patient Advocate Encounter  Received notification from [facility] that Prior Authorization for farxiga has been APPROVED from 07/31/23 to 07/30/24. Ran test claim, Copay is $4.00- one month. This test claim was processed through [facility]- copay amounts may vary at other pharmacies due to pharmacy/plan contracts, or as the patient moves through the different stages of their insurance plan.   PA #/Case ID/Reference #: [id number]

## 2023-08-07 LAB — BASIC METABOLIC PANEL WITH GFR
BUN/Creatinine Ratio: 13 (ref 10–24)
BUN: 17 mg/dL (ref 8–27)
CO2: 21 mmol/L (ref 20–29)
Calcium: 8.7 mg/dL (ref 8.6–10.2)
Chloride: 103 mmol/L (ref 96–106)
Creatinine, Ser: 1.28 mg/dL — ABNORMAL HIGH (ref 0.76–1.27)
Glucose: 119 mg/dL — ABNORMAL HIGH (ref 70–99)
Potassium: 4.4 mmol/L (ref 3.5–5.2)
Sodium: 139 mmol/L (ref 134–144)
eGFR: 64 mL/min/1.73

## 2023-09-03 ENCOUNTER — Ambulatory Visit: Payer: Medicaid Other | Admitting: Cardiology

## 2023-09-13 ENCOUNTER — Other Ambulatory Visit: Payer: Self-pay

## 2023-09-14 ENCOUNTER — Ambulatory Visit: Payer: Medicaid Other | Attending: Cardiology | Admitting: Cardiology

## 2023-09-14 ENCOUNTER — Encounter: Payer: Self-pay | Admitting: Cardiology

## 2023-09-14 VITALS — BP 138/86 | HR 88 | Ht 73.0 in | Wt 295.4 lb

## 2023-09-14 DIAGNOSIS — I2510 Atherosclerotic heart disease of native coronary artery without angina pectoris: Secondary | ICD-10-CM | POA: Diagnosis not present

## 2023-09-14 DIAGNOSIS — I255 Ischemic cardiomyopathy: Secondary | ICD-10-CM

## 2023-09-14 DIAGNOSIS — E7800 Pure hypercholesterolemia, unspecified: Secondary | ICD-10-CM | POA: Diagnosis not present

## 2023-09-14 DIAGNOSIS — I10 Essential (primary) hypertension: Secondary | ICD-10-CM | POA: Diagnosis not present

## 2023-09-14 NOTE — Patient Instructions (Addendum)
 Medication Instructions:  Your physician recommends that you continue on your current medications as directed. Please refer to the Current Medication list given to you today.  *If you need a refill on your cardiac medications before your next appointment, please call your pharmacy*   Lab Work: BMP- today If you have labs (blood work) drawn today and your tests are completely normal, you will receive your results only by: MyChart Message (if you have MyChart) OR A paper copy in the mail If you have any lab test that is abnormal or we need to change your treatment, we will call you to review the results.   Testing/Procedures: None Ordered   Follow-Up: At [facility], you and your health needs are our priority.  As part of our continuing mission to provide you with exceptional heart care, we have created designated Provider Care Teams.  These Care Teams include your primary Cardiologist (physician) and Advanced Practice Providers (APPs -  Physician Assistants and Nurse Practitioners) who all work together to provide you with the care you need, when you need it.  We recommend signing up for the patient portal called "MyChart".  Sign up information is provided on this After Visit Summary.  MyChart is used to connect with patients for Virtual Visits (Telemedicine).  Patients are able to view lab/test results, encounter notes, upcoming appointments, etc.  Non-urgent messages can be sent to your provider as well.   To learn more about what you can do with MyChart, go to [email or web address].    Your next appointment:   6 month(s)  The format for your next appointment:   In Person  Provider:   Gypsy Balsam, MD    Other Instructions NA

## 2023-09-14 NOTE — Progress Notes (Signed)
 Cardiology Office Note:    Date:  09/14/2023   ID:  Michael [name], DOB [birth year], MRN [id number]  PCP:  Marelyn Axon, MD  Cardiologist:  Lamar Fitch, MD    Referring MD: Marelyn Axon, MD   No chief complaint on file.   History of Present Illness:    Michael [name] is a 63 y.o. male past medical history significant for ischemic cardiomyopathy ejection fraction 20 to 25%, cardiac catheterization showed severe two-vessel disease however MRI showed no viability therefore he was disqualified as a potential candidate for surgery or PTCA.  I am trying gradually to put him on guideline directed medical therapy the biggest obstacle that I have is the fact that I do not think he understand completely severe status of his condition however overall seems to be doing well.  Denies have any chest pain tightness squeezing pressure burning chest seems to be happy today.  Past Medical History:  Diagnosis Date   Acute systolic CHF (congestive heart failure) (HCC)    CAD in native artery    a. multivessel by cath 07/2019 - felt to need CABG but needs improvement of diabetes + CHF first.   Chronic systolic CHF (congestive heart failure) (HCC)    a. dx 07/2019.   Dyspnea    Essential hypertension    Hypercholesterolemia    Hyperlipidemia    Ischemic cardiomyopathy    LV (left ventricular) mural thrombus    a. dx 07/2019.   Morbid obesity (HCC)    Myocardial infarction [facility])    NSTEMI (non-ST elevated myocardial infarction) (HCC) 08/08/2019   Tobacco use    Uncontrolled diabetes mellitus     Past Surgical History:  Procedure Laterality Date   LEFT HEART CATH AND CORONARY ANGIOGRAPHY N/A 08/08/2019   Procedure: LEFT HEART CATH AND CORONARY ANGIOGRAPHY;  Surgeon: Claudene Victory ORN, MD;  [facility];  Service: Cardiovascular;  Laterality: N/A;    Current Medications: Current Meds  Medication Sig   aspirin  EC 81 MG EC tablet Take 1 tablet (81 mg total) by mouth daily.   carvedilol   (COREG ) 3.125 MG tablet Take 3.125 mg by mouth 2 (two) times daily with a meal.   dapagliflozin  propanediol (FARXIGA ) 10 MG TABS tablet Take 1 tablet (10 mg total) by mouth daily.   loratadine (CLARITIN) 10 MG tablet Take 10 mg by mouth daily as needed for allergies.   rosuvastatin  (CRESTOR ) 20 MG tablet Take 20 mg by mouth daily.   sacubitril -valsartan  (ENTRESTO ) 24-26 MG Take 1 tablet by mouth 2 (two) times daily.     Allergies:   Patient has no known allergies.   Social History   Socioeconomic History   Marital status: Single    Spouse name: Not on file   Number of children: Not on file   Years of education: Not on file   Highest education level: Not on file  Occupational History   Not on file  Tobacco Use   Smoking status: Never   Smokeless tobacco: Current    Types: Chew   Tobacco comments:    not since 1987   Vaping Use   Vaping status: Never Used  Substance and Sexual Activity   Alcohol use: Never   Drug use: Never   Sexual activity: Not Currently  Other Topics Concern   Not on file  Social History Narrative   Lives alone with a cat and a dog.  Has family support from brother, aunt, uncle and cousins.    Social Drivers  of Health   Financial Resource Strain: Low Risk  (08/10/2019)   Overall Financial Resource Strain (CARDIA)    Difficulty of Paying Living Expenses: Not very hard  Food Insecurity: Unknown (08/10/2019)   Hunger Vital Sign    Worried About Running Out of Food in the Last Year: Patient declined    Ran Out of Food in the Last Year: Patient declined  Transportation Needs: Unknown (08/10/2019)   PRAPARE - Administrator, Civil Service (Medical): Patient declined    Lack of Transportation (Non-Medical): Patient declined  Physical Activity: Unknown (08/10/2019)   Exercise Vital Sign    Days of Exercise per Week: Patient declined    Minutes of Exercise per Session: Patient declined  Stress: Stress Concern Present (08/10/2019)   [employer] - Occupational Stress Questionnaire    Feeling of Stress : To some extent  Social Connections: Unknown (08/10/2019)   Social Connection and Isolation Panel [NHANES]    Frequency of Communication with Friends and Family: Patient declined    Frequency of Social Gatherings with Friends and Family: Patient declined    Attends Religious Services: Patient declined    Database Administrator or Organizations: Patient declined    Attends Banker Meetings: Patient declined    Marital Status: Patient declined     Family History: The patient's family history includes Diabetes in his mother; Heart attack in his brother; Stroke in his father. ROS:   Please see the history of present illness.    All 14 point review of systems negative except as described per history of present illness  EKGs/Labs/Other Studies Reviewed:         Recent Labs: 08/06/2023: BUN 17; Creatinine, Ser 1.28; Potassium 4.4; Sodium 139  Recent Lipid Panel    Component Value Date/Time   CHOL 269 (H) 08/08/2019 0625   TRIG 89 08/08/2019 0625   HDL 41 08/08/2019 0625   CHOLHDL 6.6 08/08/2019 0625   VLDL 18 08/08/2019 0625   LDLCALC 210 (H) 08/08/2019 0625   LDLDIRECT 135 (H) 08/09/2022 1042    Physical Exam:    VS:  BP 138/86   Pulse 88   Ht 6' 1 (1.854 m)   Wt 295 lb 6.4 oz (134 kg)   SpO2 97%   BMI 38.97 kg/m     Wt Readings from Last 3 Encounters:  09/14/23 295 lb 6.4 oz (134 kg)  07/23/23 296 lb (134.3 kg)  04/16/23 298 lb (135.2 kg)     GEN:  Well nourished, well developed in no acute distress HEENT: Normal NECK: No JVD; No carotid bruits LYMPHATICS: No lymphadenopathy CARDIAC: RRR, no murmurs, no rubs, no gallops RESPIRATORY:  Clear to auscultation without rales, wheezing or rhonchi  ABDOMEN: Soft, non-tender, non-distended MUSCULOSKELETAL:  No edema; No deformity  SKIN: Warm and dry LOWER EXTREMITIES: no swelling NEUROLOGIC:  Alert and oriented x  3 PSYCHIATRIC:  Normal affect   ASSESSMENT:    1. Essential hypertension   2. CAD in native artery   3. Ischemic cardiomyopathy   4. Hypercholesterolemia    PLAN:    In order of problems listed above:  Ischemic cardiomyopathy.  Ejection fraction severely reduced.  Will check get Chem-7 if Chem-7 is fine we will consider increasing dose of Entresto . Coronary artery disease stable denies have any chest pain tightness squeezing pressure burning chest. Essential hypertension blood presumed to be slightly on the higher side hopefully will be able to double dose Entresto  which  should help. Hyperlipidemia, I do have any recent fasting lipid profile will do the test   Medication Adjustments/Labs and Tests Ordered: Current medicines are reviewed at length with the patient today.  Concerns regarding medicines are outlined above.  Orders Placed This Encounter  Procedures   Basic metabolic panel   Medication changes: No orders of the defined types were placed in this encounter.   Signed, Lamar DOROTHA Fitch, MD, [facility] 09/14/2023 1:21 PM    [facility]

## 2023-09-15 LAB — BASIC METABOLIC PANEL
BUN/Creatinine Ratio: 12 (ref 10–24)
BUN: 14 mg/dL (ref 8–27)
CO2: 22 mmol/L (ref 20–29)
Calcium: 9 mg/dL (ref 8.6–10.2)
Chloride: 100 mmol/L (ref 96–106)
Creatinine, Ser: 1.21 mg/dL (ref 0.76–1.27)
Glucose: 217 mg/dL — ABNORMAL HIGH (ref 70–99)
Potassium: 4.1 mmol/L (ref 3.5–5.2)
Sodium: 135 mmol/L (ref 134–144)
eGFR: 69 mL/min/{1.73_m2} (ref >59)

## 2023-09-26 ENCOUNTER — Telehealth: Payer: Self-pay

## 2023-09-26 DIAGNOSIS — I10 Essential (primary) hypertension: Secondary | ICD-10-CM

## 2023-09-26 MED ORDER — ENTRESTO 24-26 MG PO TABS: 2.0000 | ORAL_TABLET | Freq: Two times a day (BID) | ORAL | Status: DC

## 2023-09-26 NOTE — Telephone Encounter (Signed)
 Lab Results reviewed with pt as per Dr. Tonja Fray note.  Pt verbalized understanding and had no additional questions. He will double up on the Entresto  will be 49-51 BID and get BMP in 1 week. Routed to PCP

## 2023-12-10 ENCOUNTER — Other Ambulatory Visit: Payer: Self-pay

## 2023-12-10 ENCOUNTER — Telehealth: Payer: Self-pay | Admitting: Cardiology

## 2023-12-10 MED ORDER — ENTRESTO 24-26 MG PO TABS: 2.0000 | ORAL_TABLET | Freq: Two times a day (BID) | ORAL | 1 refills | Status: DC

## 2023-12-10 NOTE — Telephone Encounter (Signed)
*  STAT* If patient is at the pharmacy, call can be transferred to refill team.   1. Which medications need to be refilled? (please list name of each medication and dose if known)   sacubitril-valsartan (ENTRESTO) 24-26 MG    2. Which pharmacy/location (including street and city if local pharmacy) is medication to be sent to?  [facility] - [place], [place] - [address]    3. Do they need a 30 day or 90 day supply? 90

## 2023-12-10 NOTE — Telephone Encounter (Signed)
 Medication sent.

## 2024-03-12 ENCOUNTER — Telehealth: Payer: Self-pay | Admitting: Cardiology

## 2024-03-12 MED ORDER — SACUBITRIL-VALSARTAN 24-26 MG PO TABS: 2.0000 | ORAL_TABLET | Freq: Two times a day (BID) | ORAL | 1 refills | Status: DC

## 2024-03-12 NOTE — Telephone Encounter (Signed)
 Pt's medication was sent to pt's pharmacy as requested. Confirmation received.

## 2024-03-12 NOTE — Telephone Encounter (Signed)
*  STAT* If patient is at the pharmacy, call can be transferred to refill team.   1. Which medications need to be refilled? (please list name of each medication and dose if known)   sacubitril-valsartan (ENTRESTO) 24-26 MG    2. Which pharmacy/location (including street and city if local pharmacy) is medication to be sent to?  [facility] - [place], [place] - [address]    3. Do they need a 30 day or 90 day supply? 90

## 2024-03-25 ENCOUNTER — Ambulatory Visit: Attending: Cardiology | Admitting: Cardiology

## 2024-03-25 ENCOUNTER — Encounter: Payer: Self-pay | Admitting: Cardiology

## 2024-03-25 VITALS — BP 142/76 | HR 85 | Ht 72.0 in | Wt 293.8 lb

## 2024-03-25 DIAGNOSIS — E7800 Pure hypercholesterolemia, unspecified: Secondary | ICD-10-CM | POA: Insufficient documentation

## 2024-03-25 DIAGNOSIS — I10 Essential (primary) hypertension: Secondary | ICD-10-CM | POA: Insufficient documentation

## 2024-03-25 DIAGNOSIS — I5022 Chronic systolic (congestive) heart failure: Secondary | ICD-10-CM | POA: Diagnosis present

## 2024-03-25 DIAGNOSIS — I255 Ischemic cardiomyopathy: Secondary | ICD-10-CM | POA: Diagnosis not present

## 2024-03-25 NOTE — Addendum Note (Signed)
 Addended by: ARLOA PLANAS D on: 03/25/2024 12:02 PM   Modules accepted: Orders

## 2024-03-25 NOTE — Progress Notes (Signed)
 Cardiology Office Note:    Date:  03/25/2024   ID:  Michael [name], DOB May 13, 1961, MRN [id number]  PCP:  Marelyn Axon, MD  Cardiologist:  Lamar Fitch, MD    Referring MD: Marelyn Axon, MD   Chief Complaint  Patient presents with   Follow-up    History of Present Illness:    Michael [name] is a 63 y.o. male past medical history significant for ischemic cardiomyopathy ejection fraction 20 to 25%, cardiac catheterization showed severe two-vessel disease however MRI showed no viability in this area therefore there was no point of revascularization.  I am fine gradually to put him on guideline directed medical therapy with some difficulties.  I did he perceive significance of his problems I do not think he understand that he truly have a heart disease.  He said he is doing fine he developed shortness of breath while exercising but he blames it on humidity.  Past Medical History:  Diagnosis Date   Acute systolic CHF (congestive heart failure) (HCC)    CAD in native artery    a. multivessel by cath 07/2019 - felt to need CABG but needs improvement of diabetes + CHF first.   Chronic systolic CHF (congestive heart failure) (HCC)    a. dx 07/2019.   Dyspnea    Essential hypertension    Hypercholesterolemia    Hyperlipidemia    Ischemic cardiomyopathy    LV (left ventricular) mural thrombus    a. dx 07/2019.   Morbid obesity (HCC)    Myocardial infarction [facility])    NSTEMI (non-ST elevated myocardial infarction) (HCC) 08/08/2019   Tobacco use    Uncontrolled diabetes mellitus     Past Surgical History:  Procedure Laterality Date   LEFT HEART CATH AND CORONARY ANGIOGRAPHY N/A 08/08/2019   Procedure: LEFT HEART CATH AND CORONARY ANGIOGRAPHY;  Surgeon: Claudene Victory ORN, MD;  Location: [facility];  Service: Cardiovascular;  Laterality: N/A;    Current Medications: Current Meds  Medication Sig   aspirin  EC 81 MG EC tablet Take 1 tablet (81 mg total) by mouth daily.   carvedilol   (COREG ) 3.125 MG tablet Take 3.125 mg by mouth 2 (two) times daily with a meal.   dapagliflozin  propanediol (FARXIGA ) 10 MG TABS tablet Take 1 tablet (10 mg total) by mouth daily.   loratadine (CLARITIN) 10 MG tablet Take 10 mg by mouth daily as needed for allergies.   rosuvastatin  (CRESTOR ) 20 MG tablet Take 20 mg by mouth daily.   sacubitril -valsartan  (ENTRESTO ) 24-26 MG Take 2 tablets by mouth 2 (two) times daily.     Allergies:   Patient has no known allergies.   Social History   Socioeconomic History   Marital status: Single    Spouse name: Not on file   Number of children: Not on file   Years of education: Not on file   Highest education level: Not on file  Occupational History   Not on file  Tobacco Use   Smoking status: Never   Smokeless tobacco: Current    Types: Chew   Tobacco comments:    not since 1987   Vaping Use   Vaping status: Never Used  Substance and Sexual Activity   Alcohol use: Never   Drug use: Never   Sexual activity: Not Currently  Other Topics Concern   Not on file  Social History Narrative   Lives alone with a cat and a dog.  Has family support from brother, aunt, uncle and cousins.  Social Drivers of Health   Financial Resource Strain: Low Risk  (08/10/2019)   Overall Financial Resource Strain (CARDIA)    Difficulty of Paying Living Expenses: Not very hard  Food Insecurity: Unknown (08/10/2019)   Hunger Vital Sign    Worried About Running Out of Food in the Last Year: Patient declined    Ran Out of Food in the Last Year: Patient declined  Transportation Needs: Unknown (08/10/2019)   PRAPARE - Administrator, Civil Service (Medical): Patient declined    Lack of Transportation (Non-Medical): Patient declined  Physical Activity: Unknown (08/10/2019)   Exercise Vital Sign    Days of Exercise per Week: Patient declined    Minutes of Exercise per Session: Patient declined  Stress: Stress Concern Present (08/10/2019)   [employer] - Occupational Stress Questionnaire    Feeling of Stress : To some extent  Social Connections: Unknown (08/10/2019)   Social Connection and Isolation Panel    Frequency of Communication with Friends and Family: Patient declined    Frequency of Social Gatherings with Friends and Family: Patient declined    Attends Religious Services: Patient declined    Database administrator or Organizations: Patient declined    Attends Banker Meetings: Patient declined    Marital Status: Patient declined     Family History: The patient's family history includes Diabetes in his mother; Heart attack in his brother; Stroke in his father. ROS:   Please see the history of present illness.    All 14 point review of systems negative except as described per history of present illness  EKGs/Labs/Other Studies Reviewed:         Recent Labs: 07/20/2023: TSH 2.13 09/14/2023: BUN 14; Creatinine, Ser 1.21; Potassium 4.1; Sodium 135  Recent Lipid Panel    Component Value Date/Time   CHOL 269 (H) 08/08/2019 0625   TRIG 89 08/08/2019 0625   HDL 41 08/08/2019 0625   CHOLHDL 6.6 08/08/2019 0625   VLDL 18 08/08/2019 0625   LDLCALC 210 (H) 08/08/2019 0625   LDLDIRECT 135 (H) 08/09/2022 1042    Physical Exam:    VS:  BP (!) 142/76 (BP Location: Right Arm, Patient Position: Sitting)   Pulse 85   Ht 6' (1.829 m)   Wt 293 lb 12.8 oz (133.3 kg)   SpO2 94%   BMI 39.85 kg/m     Wt Readings from Last 3 Encounters:  03/25/24 293 lb 12.8 oz (133.3 kg)  09/14/23 295 lb 6.4 oz (134 kg)  07/23/23 296 lb (134.3 kg)     GEN:  Well nourished, well developed in no acute distress HEENT: Normal NECK: No JVD; No carotid bruits LYMPHATICS: No lymphadenopathy CARDIAC: RRR, no murmurs, no rubs, no gallops RESPIRATORY:  Clear to auscultation without rales, wheezing or rhonchi  ABDOMEN: Soft, non-tender, non-distended MUSCULOSKELETAL:  No edema; No deformity  SKIN: Warm  and dry LOWER EXTREMITIES: no swelling NEUROLOGIC:  Alert and oriented x 3 PSYCHIATRIC:  Normal affect   ASSESSMENT:    1. Chronic systolic CHF (congestive heart failure) (HCC)   2. Ischemic cardiomyopathy   3. Hypercholesterolemia   4. Morbid obesity (HCC)    PLAN:    In order of problems listed above:  Chronic systolic congestive heart failure will repeat echocardiac to recheck left ventricle ejection fraction.  In the meantime we will continue guideline directed medical therapy I intended to increase dosages of this medication he is reluctant we will wait for  echocardiogram to decide. Coronary disease.  This denies having any tightness squeezing pressure burning chest or shortness of breath with exertion again we wait for results of echocardiogram to see if there is anything else that we need to do. Dyslipidemia I do not have any fasting lipid profile will do cholesterol today Diabetes, followed by his primary care physician he said that is fine we will try to call them and get a copy of hemoglobin A1c   Medication Adjustments/Labs and Tests Ordered: Current medicines are reviewed at length with the patient today.  Concerns regarding medicines are outlined above.  No orders of the defined types were placed in this encounter.  Medication changes: No orders of the defined types were placed in this encounter.   Signed, Lamar DOROTHA Fitch, MD, [facility] 03/25/2024 11:52 AM    [facility]

## 2024-03-25 NOTE — Patient Instructions (Addendum)
 Medication Instructions:  Your physician recommends that you continue on your current medications as directed. Please refer to the Current Medication list given to you today.  *If you need a refill on your cardiac medications before your next appointment, please call your pharmacy*   Lab Work: BMP, LDL Direct- today If you have labs (blood work) drawn today and your tests are completely normal, you will receive your results only by: MyChart Message (if you have MyChart) OR A paper copy in the mail If you have any lab test that is abnormal or we need to change your treatment, we will call you to review the results.   Testing/Procedures: Your physician has requested that you have an echocardiogram. Echocardiography is a painless test that uses sound waves to create images of your heart. It provides your doctor with information about the size and shape of your heart and how well your heart's chambers and valves are working. This procedure takes approximately one hour. There are no restrictions for this procedure. Please do NOT wear cologne, perfume, aftershave, or lotions (deodorant is allowed). Please arrive 15 minutes prior to your appointment time.  Please note: We ask at that you not bring children with you during ultrasound (echo/ vascular) testing. Due to room size and safety concerns, children are not allowed in the ultrasound rooms during exams. Our front office staff cannot provide observation of children in our lobby area while testing is being conducted. An adult accompanying a patient to their appointment will only be allowed in the ultrasound room at the discretion of the ultrasound technician under special circumstances. We apologize for any inconvenience.    Follow-Up: At [facility], you and your health needs are our priority.  As part of our continuing mission to provide you with exceptional heart care, we have created designated Provider Care Teams.  These Care Teams include  your primary Cardiologist (physician) and Advanced Practice Providers (APPs -  Physician Assistants and Nurse Practitioners) who all work together to provide you with the care you need, when you need it.  We recommend signing up for the patient portal called MyChart.  Sign up information is provided on this After Visit Summary.  MyChart is used to connect with patients for Virtual Visits (Telemedicine).  Patients are able to view lab/test results, encounter notes, upcoming appointments, etc.  Non-urgent messages can be sent to your provider as well.   To learn more about what you can do with MyChart, go to [email or web address].    Your next appointment:   6 month(s)  The format for your next appointment:   In Person  Provider:   Lamar Fitch, MD    Other Instructions NA

## 2024-03-27 LAB — BASIC METABOLIC PANEL WITH GFR
BUN/Creatinine Ratio: 12 (ref 10–24)
BUN: 13 mg/dL (ref 8–27)
CO2: 16 mmol/L — ABNORMAL LOW (ref 20–29)
Calcium: 9.4 mg/dL (ref 8.6–10.2)
Chloride: 105 mmol/L (ref 96–106)
Creatinine, Ser: 1.09 mg/dL (ref 0.76–1.27)
Glucose: 166 mg/dL — ABNORMAL HIGH (ref 70–99)
Potassium: 4.2 mmol/L (ref 3.5–5.2)
Sodium: 142 mmol/L (ref 134–144)
eGFR: 78 mL/min/1.73 (ref >59)

## 2024-03-27 LAB — LDL CHOLESTEROL, DIRECT: LDL Direct: 99 mg/dL (ref 0–99)

## 2024-03-28 ENCOUNTER — Ambulatory Visit: Payer: Self-pay | Admitting: Cardiology

## 2024-04-02 ENCOUNTER — Telehealth: Payer: Self-pay

## 2024-04-02 DIAGNOSIS — E782 Mixed hyperlipidemia: Secondary | ICD-10-CM

## 2024-04-02 MED ORDER — ROSUVASTATIN CALCIUM 40 MG PO TABS: 40.0000 mg | ORAL_TABLET | Freq: Every day | ORAL | 3 refills | Status: AC

## 2024-04-02 NOTE — Telephone Encounter (Signed)
 Lab Results reviewed with pt as per Dr. Karry note. Will increase Rosuvastatin  to 40mg  daily.  Pt verbalized understanding and had no additional questions. Routed to PCP

## 2024-04-16 ENCOUNTER — Ambulatory Visit

## 2024-05-07 ENCOUNTER — Ambulatory Visit: Attending: Cardiology

## 2024-05-21 LAB — LAB REPORT - SCANNED
A1c: 8.1
EGFR: 86

## 2024-06-05 ENCOUNTER — Encounter: Payer: Self-pay | Admitting: Family Medicine

## 2024-06-23 ENCOUNTER — Telehealth: Payer: Self-pay | Admitting: Cardiovascular Disease

## 2024-06-23 ENCOUNTER — Telehealth: Payer: Self-pay | Admitting: Cardiology

## 2024-06-23 MED ORDER — SACUBITRIL-VALSARTAN 24-26 MG PO TABS: 2.0000 | ORAL_TABLET | Freq: Two times a day (BID) | ORAL | 2 refills | Status: DC

## 2024-06-23 NOTE — Telephone Encounter (Signed)
*  STAT* If patient is at the pharmacy, call can be transferred to refill team.   1. Which medications need to be refilled? (please list name of each medication and dose if known) sacubitril -valsartan  (ENTRESTO ) 24-26 MG    2. Would you like to learn more about the convenience, safety, & potential cost savings by using the [facility]? No   3. Are you open to using the [facility] (Type [facility]. No    4. Which pharmacy/location (including street and city if local pharmacy) is medication to be sent to?[facility] - [place], [place] - [address]    5. Do they need a 30 day or 90 day supply? 90 day

## 2024-06-23 NOTE — Telephone Encounter (Signed)
 Pt was supposed to have an echo in 2024/05/21, but had a death in the immediately family. Pt requesting order be put back in so he can schedule. Please advise.

## 2024-06-23 NOTE — Telephone Encounter (Signed)
 Rx sent in

## 2024-07-11 ENCOUNTER — Ambulatory Visit: Attending: Cardiology

## 2024-07-11 DIAGNOSIS — I255 Ischemic cardiomyopathy: Secondary | ICD-10-CM | POA: Insufficient documentation

## 2024-07-11 LAB — ECHOCARDIOGRAM COMPLETE
AR max vel: 3 cm2
AV Area VTI: 3 cm2
AV Area mean vel: 2.87 cm2
AV Mean grad: 2.1 mmHg
AV Peak grad: 3.9 mmHg
Ao pk vel: 0.98 m/s
Area-P 1/2: 6.6 cm2
S' Lateral: 4.8 cm

## 2024-08-09 ENCOUNTER — Telehealth: Payer: Self-pay | Admitting: Cardiology

## 2024-08-20 NOTE — Telephone Encounter (Signed)
°*  STAT* If patient is at the pharmacy, call can be transferred to refill team.   1. Which medications need to be refilled? (please list name of each medication and dose if known)   FARXIGA  10 MG TABS tablet   2. Would you like to learn more about the convenience, safety, & potential cost savings by using the [facility]?   3. Are you open to using the [facility] ([facility]. ).  4. Which pharmacy/location (including street and city if local pharmacy) is medication to be sent to?  [facility] - [place], [place] - [address]   5. Do they need a 30 day or 90 day supply?   90 day  Patient stated he is completely out of this medication.    Patient has appointment scheduled with Dr. Bernie on 1/16.

## 2024-08-20 NOTE — Telephone Encounter (Signed)
 Refills were sent to the pharmacy on 08-14-24.

## 2024-08-26 ENCOUNTER — Telehealth: Payer: Self-pay | Admitting: Cardiovascular Disease

## 2024-08-26 ENCOUNTER — Telehealth: Payer: Self-pay | Admitting: Pharmacy Technician

## 2024-08-26 ENCOUNTER — Other Ambulatory Visit (HOSPITAL_COMMUNITY): Payer: Self-pay

## 2024-08-26 MED ORDER — SACUBITRIL-VALSARTAN 24-26 MG PO TABS: 2.0000 | ORAL_TABLET | Freq: Two times a day (BID) | ORAL | 2 refills | Status: DC

## 2024-08-26 NOTE — Telephone Encounter (Signed)
 Per pt the pharmacy is telling him that it is still pending insurance. Please advise

## 2024-08-26 NOTE — Telephone Encounter (Signed)
°*  STAT* If patient is at the pharmacy, call can be transferred to refill team.   1. Which medications need to be refilled? (please list name of each medication and dose if known)   sacubitril -valsartan  (ENTRESTO ) 24-26 MG    2. Which pharmacy/location (including street and city if local pharmacy) is medication to be sent to? [facility] [place] - [address] Phone: [phone]  Fax: [phone]      3. Do they need a 30 day or 90 day supply? 90 Pt is out of medication

## 2024-08-26 NOTE — Telephone Encounter (Signed)
 Pharmacy Patient Advocate Encounter   Received notification from Physician's Office that prior authorization for farxiga  10 is required/requested.   Insurance verification completed.   The patient is insured through [facility] MEDICAID.   Per test claim: PA required; PA submitted to above mentioned insurance via Latent Key/confirmation #/EOC [id number] Status is pending

## 2024-08-26 NOTE — Telephone Encounter (Signed)
 Refill sent

## 2024-08-27 ENCOUNTER — Other Ambulatory Visit (HOSPITAL_COMMUNITY): Payer: Self-pay

## 2024-08-27 NOTE — Telephone Encounter (Signed)
 Pharmacy Patient Advocate Encounter  Received notification from [facility] MEDICAID that Prior Authorization for farxiga  has been APPROVED from 08/26/24 to 08/26/25   PA #/Case ID/Reference #: [id number]

## 2024-08-29 ENCOUNTER — Telehealth: Payer: Self-pay | Admitting: Cardiovascular Disease

## 2024-08-29 NOTE — Telephone Encounter (Signed)
 Pt called in today upset that his Entresto  could not be refilled because insurance approval and the pharmacy told him it was too early to pick up.   Called pharmacy to see what the problem was with his refill. She states pt picked up 24 tablets on 08/27/24 and she told him it was too early to fill. If he is taking as prescribed he should have enough until 09/02/24. She states his insurance will cover a fill starting 08/31/24 and he can come pick it up then.  Called pt back to inform him he should have enough pills to last till 09/02/24, but the pharmacy said insurance will allow a pick up on 08/31/24. He states I have 6 pills left and I can't keep coming up there because I live far away. Informed him there is nothing we can do about that on our end and that our refill team has sent over a supply and he can pick it up at his earliest convenience. Call disconnected.

## 2024-09-02 ENCOUNTER — Other Ambulatory Visit (HOSPITAL_COMMUNITY): Payer: Self-pay

## 2024-09-02 ENCOUNTER — Telehealth: Payer: Self-pay

## 2024-09-02 NOTE — Telephone Encounter (Addendum)
 Please advise if patient can take 49/51 mg T1T PO BID instead of 24-26 mg T2T PO BID or clarify why patient needs 2 tablets of 24-26 twice daily.   Plan will cover brand name BID max 2 tabs daily. Per pharmacy, they will not fill brand (see fax on chart media) and they are requesting a PA on generic. Generic PA will not be approved without a clinical indication as to why patient cannot take brand. Patient may need to get this at another pharmacy.

## 2024-09-05 ENCOUNTER — Other Ambulatory Visit: Payer: Self-pay

## 2024-09-05 MED ORDER — SACUBITRIL-VALSARTAN 49-51 MG PO TABS: 1.0000 | ORAL_TABLET | Freq: Two times a day (BID) | ORAL | 3 refills | Status: AC

## 2024-09-05 NOTE — Progress Notes (Signed)
 SABRA

## 2024-09-05 NOTE — Telephone Encounter (Signed)
 Called the patient and informed him of Dr. Karry recommendation below:  Yes, absolutely 4951 twice daily either generic or brand name  Patient verbalized understanding and had no further questions at this time.

## 2024-09-26 ENCOUNTER — Ambulatory Visit: Attending: Cardiology | Admitting: Cardiology
